# Patient Record
Sex: Male | Born: 1975 | Race: White | Hispanic: No | Marital: Single | Smoking: Never smoker
Health system: Southern US, Community
[De-identification: ages and names within clinical notes are randomized; demographics above are authoritative.]

## PROBLEM LIST (undated history)

## (undated) DIAGNOSIS — N200 Calculus of kidney: Secondary | ICD-10-CM

## (undated) DIAGNOSIS — R319 Hematuria, unspecified: Secondary | ICD-10-CM

## (undated) DIAGNOSIS — N201 Calculus of ureter: Secondary | ICD-10-CM

## (undated) DIAGNOSIS — R350 Frequency of micturition: Secondary | ICD-10-CM

## (undated) DIAGNOSIS — K5900 Constipation, unspecified: Secondary | ICD-10-CM

## (undated) DIAGNOSIS — I10 Essential (primary) hypertension: Secondary | ICD-10-CM

## (undated) DIAGNOSIS — Z973 Presence of spectacles and contact lenses: Secondary | ICD-10-CM

## (undated) DIAGNOSIS — Z87442 Personal history of urinary calculi: Secondary | ICD-10-CM

## (undated) DIAGNOSIS — R300 Dysuria: Secondary | ICD-10-CM

## (undated) DIAGNOSIS — S83519A Sprain of anterior cruciate ligament of unspecified knee, initial encounter: Secondary | ICD-10-CM

## (undated) DIAGNOSIS — M1990 Unspecified osteoarthritis, unspecified site: Secondary | ICD-10-CM

## (undated) DIAGNOSIS — R109 Unspecified abdominal pain: Secondary | ICD-10-CM

## (undated) DIAGNOSIS — F419 Anxiety disorder, unspecified: Secondary | ICD-10-CM

## (undated) HISTORY — DX: Anxiety disorder, unspecified: F41.9

## (undated) HISTORY — PX: LAPAROSCOPIC APPENDECTOMY: SUR753

---

## 1998-04-07 ENCOUNTER — Emergency Department (HOSPITAL_COMMUNITY): Admission: EM | Admit: 1998-04-07 | Discharge: 1998-04-07 | Payer: Self-pay | Admitting: Emergency Medicine

## 1998-05-29 ENCOUNTER — Ambulatory Visit (HOSPITAL_COMMUNITY): Admission: RE | Admit: 1998-05-29 | Discharge: 1998-05-29 | Payer: Self-pay | Admitting: Urology

## 1998-09-28 ENCOUNTER — Emergency Department (HOSPITAL_COMMUNITY): Admission: EM | Admit: 1998-09-28 | Discharge: 1998-09-28 | Payer: Self-pay | Admitting: Emergency Medicine

## 1999-03-22 ENCOUNTER — Encounter: Payer: Self-pay | Admitting: Emergency Medicine

## 1999-03-22 ENCOUNTER — Emergency Department (HOSPITAL_COMMUNITY): Admission: EM | Admit: 1999-03-22 | Discharge: 1999-03-22 | Payer: Self-pay | Admitting: Emergency Medicine

## 2001-06-02 ENCOUNTER — Encounter: Payer: Self-pay | Admitting: Emergency Medicine

## 2001-06-02 ENCOUNTER — Emergency Department (HOSPITAL_COMMUNITY): Admission: EM | Admit: 2001-06-02 | Discharge: 2001-06-02 | Payer: Self-pay | Admitting: Emergency Medicine

## 2001-10-26 ENCOUNTER — Emergency Department (HOSPITAL_COMMUNITY): Admission: EM | Admit: 2001-10-26 | Discharge: 2001-10-26 | Payer: Self-pay | Admitting: Emergency Medicine

## 2001-10-26 ENCOUNTER — Encounter: Payer: Self-pay | Admitting: Emergency Medicine

## 2003-05-09 ENCOUNTER — Encounter: Payer: Self-pay | Admitting: Emergency Medicine

## 2003-05-09 ENCOUNTER — Emergency Department (HOSPITAL_COMMUNITY): Admission: EM | Admit: 2003-05-09 | Discharge: 2003-05-09 | Payer: Self-pay | Admitting: Emergency Medicine

## 2004-05-11 ENCOUNTER — Emergency Department (HOSPITAL_COMMUNITY): Admission: EM | Admit: 2004-05-11 | Discharge: 2004-05-11 | Payer: Self-pay | Admitting: Emergency Medicine

## 2004-05-20 ENCOUNTER — Ambulatory Visit (HOSPITAL_COMMUNITY): Admission: RE | Admit: 2004-05-20 | Discharge: 2004-05-20 | Payer: Self-pay | Admitting: Orthopedic Surgery

## 2004-05-31 ENCOUNTER — Emergency Department (HOSPITAL_COMMUNITY): Admission: EM | Admit: 2004-05-31 | Discharge: 2004-06-01 | Payer: Self-pay

## 2005-03-27 ENCOUNTER — Emergency Department (HOSPITAL_COMMUNITY): Admission: EM | Admit: 2005-03-27 | Discharge: 2005-03-27 | Payer: Self-pay | Admitting: *Deleted

## 2005-04-07 ENCOUNTER — Emergency Department (HOSPITAL_COMMUNITY): Admission: EM | Admit: 2005-04-07 | Discharge: 2005-04-07 | Payer: Self-pay | Admitting: Emergency Medicine

## 2006-01-16 ENCOUNTER — Encounter (INDEPENDENT_AMBULATORY_CARE_PROVIDER_SITE_OTHER): Payer: Self-pay | Admitting: *Deleted

## 2006-01-16 ENCOUNTER — Ambulatory Visit (HOSPITAL_COMMUNITY): Admission: EM | Admit: 2006-01-16 | Discharge: 2006-01-16 | Payer: Self-pay | Admitting: Emergency Medicine

## 2006-05-28 IMAGING — CR DG KNEE COMPLETE 4+V*R*
4 series · 4 of 4 positions shown · non-contrast
Comparison: 05/11/04 and MRI from 05/20/04.

CLINICAL DATA: Knee injury with pain.  
 RIGHT KNEE (FOUR VIEWS) 05/31/04

[view not recorded (1 of 4)]
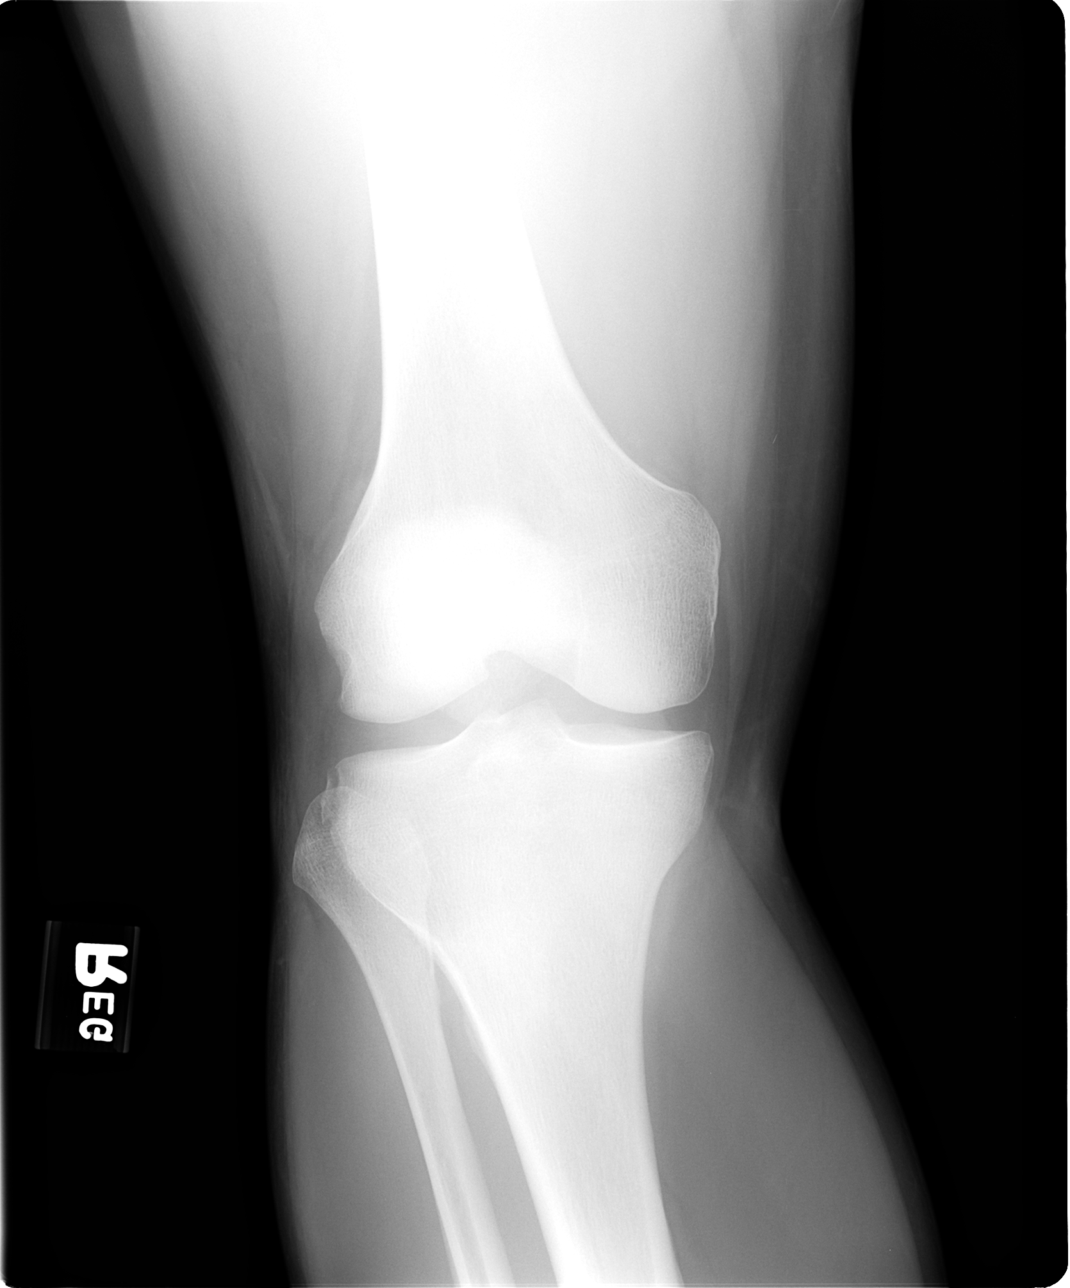

[view not recorded (2 of 4)]
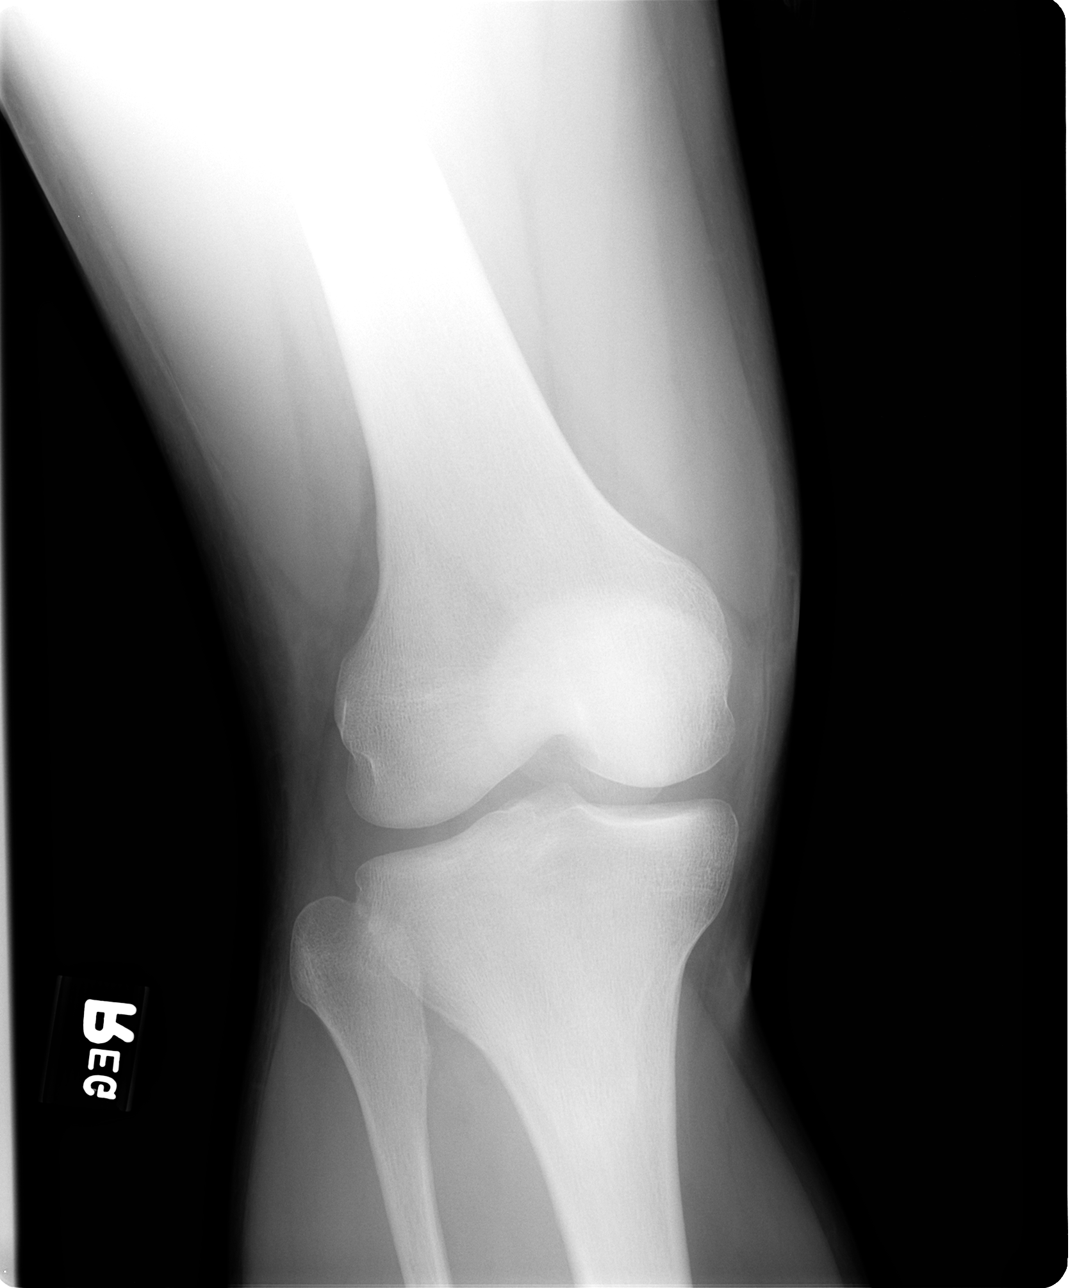

[view not recorded (3 of 4)]
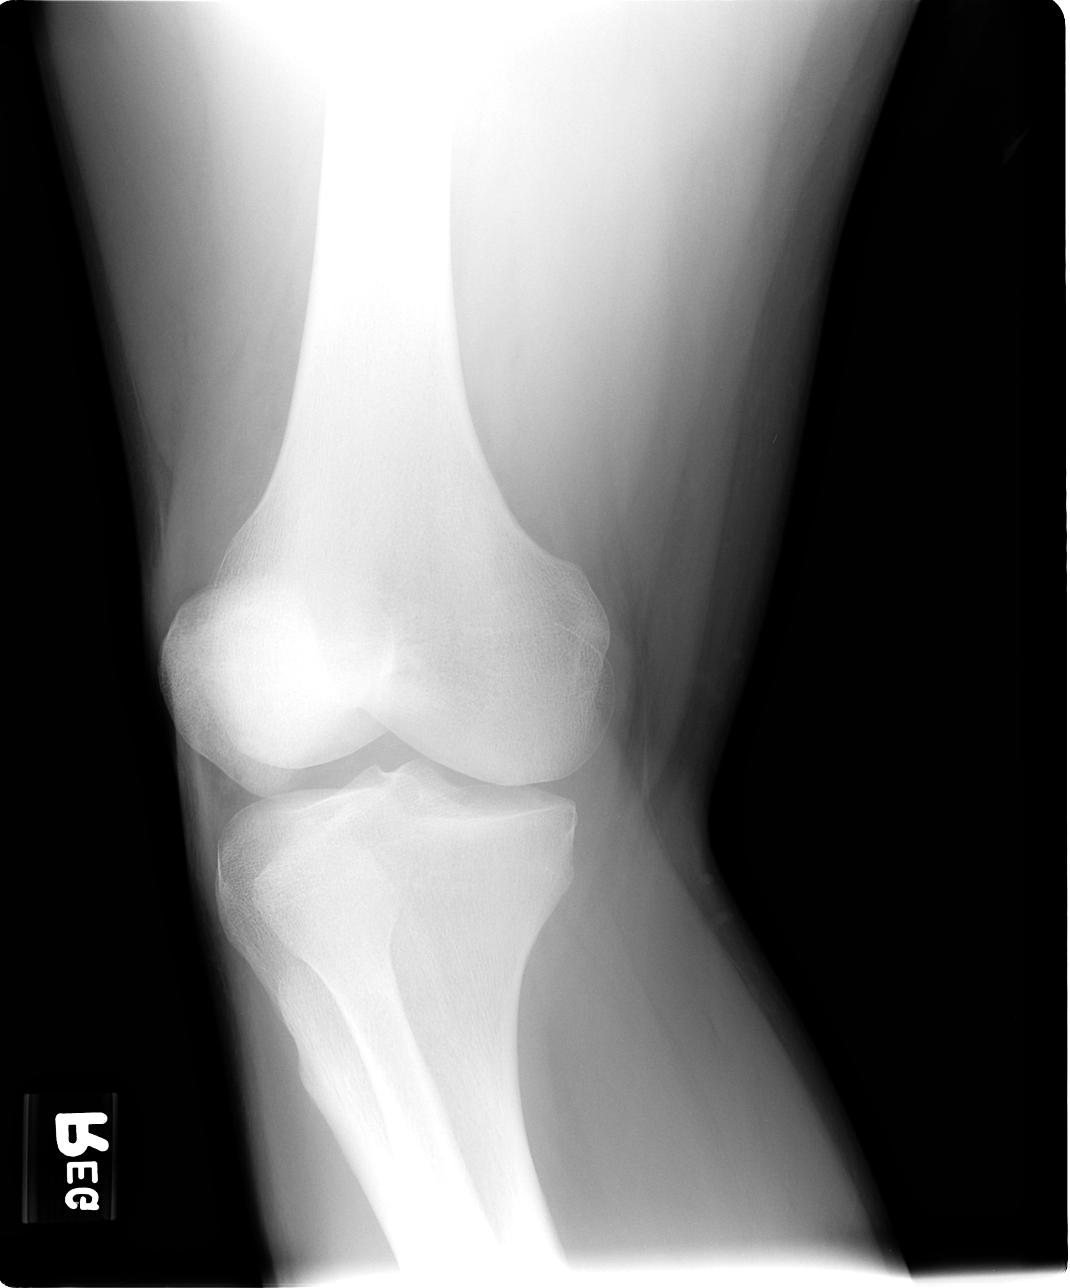

[view not recorded (4 of 4)]
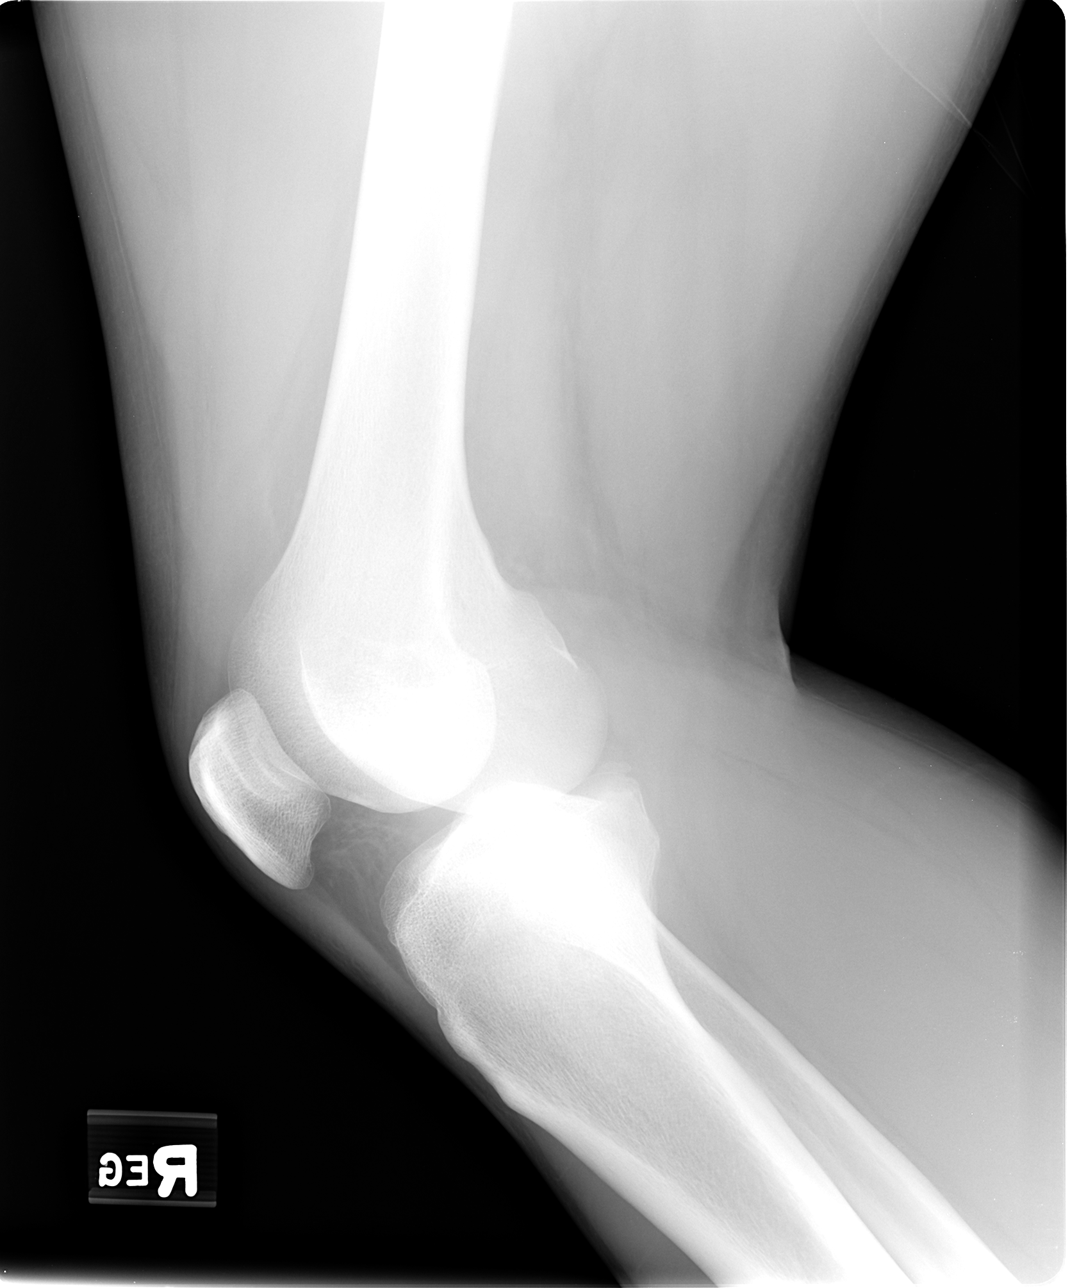

[4 of 4 positions shown; findings below may reference images not displayed]

FINDINGS: Four view exam of the right knee again shows the avulsion fragment adjacent to the lateral tibial plateau, stable.  No acute or new bony findings apparent.  Fluid in the suprapatellar bursa is again noted. 
 IMPRESSION
 Stable exam without acute bony abnormality.

## 2007-05-26 ENCOUNTER — Emergency Department (HOSPITAL_COMMUNITY): Admission: EM | Admit: 2007-05-26 | Discharge: 2007-05-26 | Payer: Self-pay | Admitting: Emergency Medicine

## 2010-10-08 ENCOUNTER — Emergency Department (HOSPITAL_COMMUNITY)
Admission: EM | Admit: 2010-10-08 | Discharge: 2010-10-08 | Payer: Self-pay | Source: Home / Self Care | Admitting: Emergency Medicine

## 2011-01-07 LAB — URINALYSIS, ROUTINE W REFLEX MICROSCOPIC
Bilirubin Urine: NEGATIVE
Glucose, UA: NEGATIVE mg/dL
Hgb urine dipstick: NEGATIVE
pH: 7.5 (ref 5.0–8.0)

## 2011-03-15 NOTE — Op Note (Signed)
NAMEELVERT, CUMPTON NO.:  [id number]   MEDICAL RECORD NO.:  [id number]          PATIENT TYPE:  INP   LOCATION:  0098                         FACILITY:  [facility]   PHYSICIAN:  Sharlet Salina T. Hoxworth, M.D.DATE OF BIRTH:  [birth year]   DATE OF PROCEDURE:  01/16/2006  DATE OF DISCHARGE:                                 OPERATIVE REPORT   PRE-AND-POSTOPERATIVE DIAGNOSIS:  Acute appendicitis.   SURGICAL PROCEDURE:  Laparoscopic appendectomy.   SURGEON:  Lorne Skeens. Hoxworth, M.D.   ANESTHESIA:  General.   BRIEF HISTORY:  Arnez Stoneking is a 35 year old male who presents with 2 days  of persistent right lower quadrant abdominal pain, nausea, and vomiting. He  has had a CT scan showing evidence of appendicitis. Laparoscopic  appendectomy has been recommended and accepted. The nature of the procedure,  indications, risks of bleeding, infection, and possible need for open  procedure were discussed and understood. He is now brought to the operating  room for this procedure.   DESCRIPTION OF PROCEDURE:  The patient was brought to operating room and  placed in the supine position on the operating table and general  endotracheal anesthesia was induced. He received broad spectrum preoperative  antibiotics. The abdomen was widely sterilely prepped and draped and a Foley  catheter placed. Local anesthesia was used to infiltrate the trocar sites.  An open Roseanne Reno technique was used at the umbilicus through a mattress suture  of #0 Vicryl and a 5-mm trocar placed in the right upper quadrant and an 11-  mm trocar in the left lower quadrant. The appendix was exposed and was  acutely inflamed without evidence of perforation or gangrene.   The appendix was mobilized dividing lateral peritoneal attachments with the  harmonic scalpel. The mesoappendix was then sequentially divided with the  harmonic scalpel completely freeing the appendix at its base which was  relatively not inflamed. The  appendix was then divided at its base with a  firing of the Endo GIA 45-mm blue load stapler. The appendix was placed in  an EndoCatch bag, and brought out through the umbilicus. The operative site  was inspect for hemostasis and was irrigated and hemostasis was complete.  Trocars were then removed and the mattress sutures secured at the umbilicus;  and all CO2 evacuated. Skin was closed with interrupted subcuticular 4-0  Monocryl and Steri-Strips.  Sponge, needle, and instrument counts were  correct. Dry dressings were applied; and the patient taken to the recovery  room in good condition.      Lorne Skeens. Hoxworth, M.D.  Electronically Signed     BTH/MEDQ  D:  01/16/2006  T:  01/17/2006  Job:  [id number]

## 2011-03-15 NOTE — H&P (Signed)
NAMEMARKEL, KURTENBACH                 ACCOUNT NO.:  [id number]   MEDICAL RECORD NO.:  [id number]          PATIENT TYPE:  INP   LOCATION:  1008                         FACILITY:  [facility]   PHYSICIAN:  Sharlet Salina T. Hoxworth, M.D.DATE OF BIRTH:  18-Feb-1976   DATE OF ADMISSION:  01/15/2006  DATE OF DISCHARGE:  01/16/2006                                HISTORY & PHYSICAL   CHIEF COMPLAINT:  Right lower quadrant abdominal pain.   HISTORY OF PRESENT ILLNESS:  Erik Dunlap is a generally healthy 35 year old  male who presents with a 2- to 3-day history of gradually worsening  abdominal pain localized in the right lower quadrant. This is constant,  aching and sharp pain. Worse with movement. He has had nausea and vomited  yesterday. He has a history of kidney stones and initially thought this was  related to that, but the pain has been more constant and severe. Denies  fever or chills. No urinary symptoms.   PAST MEDICAL HISTORY:  Has history of kidney stones. No previous surgery,  serious medical illness or hospitalizations.   MEDICATIONS:  None.   ALLERGIES:  None.   SOCIAL HISTORY:  Works doing heavy loading work. Does not smoke cigarettes.  He used to drink somewhat heavily but none recently.   FAMILY HISTORY:  Noncontributory.   REVIEW OF SYSTEMS:  GENERAL:  No fever or chills. RESPIRATORY:  Denies  shortness of breath, cough, wheezing. CARDIAC:  Denies chest pain, history  of cardiac disease. ABDOMEN:  GI as above. GU:  No urinary burning or  frequency.   PHYSICAL EXAMINATION:  VITAL SIGNS:  Temperature 97.4, pulse 64, respiration  20, blood pressure 137/63.  GENERAL:  Muscular white male in no acute distress.  SKIN:  Warm and dry.  HEENT:  Sclerae nonicteric. No masses.  LUNGS:  Clear without wheezing or increased work of breathing.  CARDIAC:  Regular rate and rhythm. No murmurs. No edema.  ABDOMEN:  Hypoactive bowel sounds. Nondistended. There is well localized  right lower  quadrant tenderness with guarding. No palpable mass or  hepatosplenomegaly.  EXTREMITIES:  No joint swelling or deformity.  NEUROLOGIC:  Alert, oriented.   LABORATORY:  White count elevated at 13,800; hemoglobin 15. Urinalysis  negative. Electrolytes within normal limits.   CT scan of the abdomen and pelvis was obtained emergency room which reveals  evidence of early appendicitis without perforation or abscess.   ASSESSMENT AND PLAN:  Acute appendicitis. The patient will be treated with  IV fluids, broad-spectrum antibiotics, and will be taken to the operating  room for laparoscopic appendectomy.      Lorne Skeens. Hoxworth, M.D.  Electronically Signed     BTH/MEDQ  D:  01/16/2006  T:  01/16/2006  Job:  [id number]

## 2011-03-26 ENCOUNTER — Emergency Department (HOSPITAL_COMMUNITY): Payer: Self-pay

## 2011-03-26 ENCOUNTER — Emergency Department (HOSPITAL_COMMUNITY)
Admission: EM | Admit: 2011-03-26 | Discharge: 2011-03-26 | Disposition: A | Discharge status: Home / Self Care | Payer: Self-pay | Attending: Emergency Medicine | Admitting: Emergency Medicine

## 2011-03-26 DIAGNOSIS — R110 Nausea: Secondary | ICD-10-CM | POA: Insufficient documentation

## 2011-03-26 DIAGNOSIS — H53149 Visual discomfort, unspecified: Secondary | ICD-10-CM | POA: Insufficient documentation

## 2011-03-26 DIAGNOSIS — R51 Headache: Secondary | ICD-10-CM | POA: Insufficient documentation

## 2011-03-26 DIAGNOSIS — M542 Cervicalgia: Secondary | ICD-10-CM | POA: Insufficient documentation

## 2011-08-12 LAB — DIFFERENTIAL
Lymphocytes Relative: 34
Lymphs Abs: 2.2
Monocytes Relative: 8
Neutro Abs: 3.5
Neutrophils Relative %: 56

## 2011-08-12 LAB — RAPID URINE DRUG SCREEN, HOSP PERFORMED
Amphetamines: NOT DETECTED
Benzodiazepines: NOT DETECTED
Cocaine: NOT DETECTED
Opiates: NOT DETECTED

## 2011-08-12 LAB — BASIC METABOLIC PANEL
Calcium: 9.2
GFR calc Af Amer: >60
Glucose, Bld: 82
Potassium: 4
Sodium: 138

## 2011-08-12 LAB — CBC
HCT: 40.1
MCHC: 35.3
RBC: 4.68
WBC: 6.4

## 2011-08-12 LAB — ETHANOL: Alcohol, Ethyl (B): <5

## 2011-09-10 ENCOUNTER — Emergency Department (HOSPITAL_COMMUNITY)
Admission: EM | Admit: 2011-09-10 | Discharge: 2011-09-11 | Disposition: A | Discharge status: Home / Self Care | Payer: Self-pay | Attending: Emergency Medicine | Admitting: Emergency Medicine

## 2011-09-10 ENCOUNTER — Encounter: Payer: Self-pay | Admitting: Emergency Medicine

## 2011-09-10 DIAGNOSIS — R110 Nausea: Secondary | ICD-10-CM | POA: Insufficient documentation

## 2011-09-10 DIAGNOSIS — R197 Diarrhea, unspecified: Secondary | ICD-10-CM | POA: Insufficient documentation

## 2011-09-10 DIAGNOSIS — R109 Unspecified abdominal pain: Secondary | ICD-10-CM | POA: Insufficient documentation

## 2011-09-10 DIAGNOSIS — R10A2 Flank pain, left side: Secondary | ICD-10-CM

## 2011-09-10 DIAGNOSIS — R300 Dysuria: Secondary | ICD-10-CM

## 2011-09-10 DIAGNOSIS — R350 Frequency of micturition: Secondary | ICD-10-CM | POA: Insufficient documentation

## 2011-09-10 MED ORDER — ONDANSETRON HCL 4 MG/2ML IJ SOLN: 4.0000 mg | Freq: Once | INTRAMUSCULAR | Status: DC

## 2011-09-10 MED ORDER — HYDROMORPHONE HCL PF 1 MG/ML IJ SOLN
1.0000 mg | Freq: Once | INTRAMUSCULAR | Status: AC
  Administered 2011-09-11: 1 mg via INTRAVENOUS
  Filled 2011-09-10: qty 1

## 2011-09-10 NOTE — ED Notes (Signed)
Patient with left side pain for one week.  Patient having nausea and diarrhea.

## 2011-09-10 NOTE — ED Notes (Signed)
Pt in restroom trying to provide urine sample.

## 2011-09-11 ENCOUNTER — Emergency Department (HOSPITAL_COMMUNITY): Payer: Self-pay

## 2011-09-11 MED ORDER — HYDROCODONE-ACETAMINOPHEN 5-325 MG PO TABS: 1.0000 | ORAL_TABLET | Freq: Four times a day (QID) | ORAL | Status: AC | PRN

## 2011-09-11 MED ORDER — ONDANSETRON HCL 4 MG/2ML IJ SOLN
INTRAMUSCULAR | Status: AC
  Filled 2011-09-11: qty 2

## 2011-09-11 NOTE — ED Provider Notes (Signed)
Medical screening examination/treatment/procedure(s) were conducted as a shared visit with non-physician practitioner(s) and myself.  I personally evaluated the patient during the encounter.  Pt with normal urine, to follow up with urology for ongoing flank pain and dysuria.  Olivia Mackie, MD 09/11/11 [phone]

## 2011-09-11 NOTE — ED Notes (Signed)
Pt to CT scan.

## 2011-09-11 NOTE — ED Provider Notes (Signed)
History     CSN: [id number] Arrival date & time: 09/10/2011  7:44 PM   First MD Initiated Contact with Patient 09/10/11 2340      Chief Complaint  Patient presents with  . Flank Pain    (Consider location/radiation/quality/duration/timing/severity/associated sxs/prior treatment) HPI Comments: Patient reports left flank pain with associated nausea x 2 weeks.  Today the pain became much more severe and began radiating into his testicles, he developed pain with urination and urinary frequency.  This morning he also had diarrhea.  Denies fever, vomiting, penile discharge, testicular swelling.    Patient is a 35 y.o. male presenting with flank pain.  Flank Pain    Past Medical History  Diagnosis Date  . Kidney calculus   . Kidney calculi     Past Surgical History  Procedure Date  . Appendectomy     History reviewed. No pertinent family history.  History  Substance Use Topics  . Smoking status: Never Smoker   . Smokeless tobacco: Not on file  . Alcohol Use: No      Review of Systems  Genitourinary: Positive for flank pain.  All other systems reviewed and are negative.    Allergies  Review of patient's allergies indicates no known allergies.  Home Medications   Current Outpatient Rx  Name Route Sig Dispense Refill  . ALPRAZOLAM 0.25 MG PO TABS Oral Take 0.25 mg by mouth at bedtime as needed. For anxiety/sleep.      BP 121/70  Pulse 96  Temp(Src) 97.7 F (36.5 C) (Oral)  Resp 17  SpO2 100%  Physical Exam  Constitutional: He is oriented to person, place, and time. He appears well-developed and well-nourished.  HENT:  Head: Normocephalic and atraumatic.  Neck: Neck supple.  Cardiovascular: Normal rate, regular rhythm and normal heart sounds.   Pulmonary/Chest: Breath sounds normal. No respiratory distress. He has no wheezes. He has no rales. He exhibits no tenderness.  Abdominal: Soft. Bowel sounds are normal. There is tenderness in the left upper  quadrant and left lower quadrant. There is CVA tenderness. No hernia. Hernia confirmed negative in the right inguinal area and confirmed negative in the left inguinal area.  Genitourinary: Testes normal and penis normal. Right testis shows no mass, no swelling and no tenderness. Left testis shows no mass, no swelling and no tenderness. Circumcised. No discharge found.  Lymphadenopathy:       Right: No inguinal adenopathy present.       Left: No inguinal adenopathy present.  Neurological: He is alert and oriented to person, place, and time.    ED Course  Procedures (including critical care time)   Labs Reviewed  URINALYSIS, ROUTINE W REFLEX MICROSCOPIC   Ct Abdomen Pelvis Wo Contrast  09/11/2011  *RADIOLOGY REPORT*  Clinical Data: 35 year old male with left flank pain, dysuria, frequency.  CT ABDOMEN AND PELVIS WITHOUT CONTRAST  Technique:  Multidetector CT imaging of the abdomen and pelvis was performed following the standard protocol without intravenous contrast.  Comparison: 01/16/2006.  Findings: Minor atelectasis at the lung bases. No acute osseous abnormality identified.  No pelvic free fluid.  Decompressed distal colon.  Sequelae of appendectomy at the cecum.  No dilated small bowel loops.  Negative noncontrast stomach, duodenum, liver, gallbladder (phrygian cap), spleen, pancreas, and adrenal glands.  Mid pole left nephrolithiasis measures up to 4 mm.  No left hydronephrosis, hydroureter, or perinephric stranding.  No calculus is identified along the course of the left ureter or within the bladder. Punctate right upper and  lower pole nephrolithiasis.  No right hydronephrosis, hydroureter or perinephric stranding.  No abdominal free fluid.  IMPRESSION: Bilateral nephrolithiasis without obstructive uropathy or acute inflammatory process in the abdomen or pelvis.  Original Report Authenticated By: Ulla Potash III, M.D.     1. Left flank pain   2. Dysuria       MDM  Patient signed out  to Dr Norlene Campbell at change of shift pending UA results.  Patient to follow up with urology regardless of outcome.          Dillard Cannon [place], [place] 09/11/11 [phone]

## 2013-03-25 ENCOUNTER — Ambulatory Visit: Payer: Self-pay

## 2013-03-25 ENCOUNTER — Ambulatory Visit: Payer: Self-pay | Admitting: Family Medicine

## 2013-03-25 ENCOUNTER — Emergency Department (HOSPITAL_COMMUNITY): Payer: Self-pay

## 2013-03-25 ENCOUNTER — Emergency Department (HOSPITAL_COMMUNITY)
Admission: EM | Admit: 2013-03-25 | Discharge: 2013-03-25 | Disposition: A | Discharge status: Home / Self Care | Payer: Self-pay | Attending: Emergency Medicine | Admitting: Emergency Medicine

## 2013-03-25 ENCOUNTER — Encounter (HOSPITAL_COMMUNITY): Payer: Self-pay

## 2013-03-25 VITALS — BP 124/76 | HR 72 | Temp 98.0°F | Resp 17 | Ht 68.5 in | Wt 206.0 lb

## 2013-03-25 DIAGNOSIS — M1990 Unspecified osteoarthritis, unspecified site: Secondary | ICD-10-CM | POA: Insufficient documentation

## 2013-03-25 DIAGNOSIS — M545 Low back pain, unspecified: Secondary | ICD-10-CM

## 2013-03-25 DIAGNOSIS — Z87442 Personal history of urinary calculi: Secondary | ICD-10-CM | POA: Insufficient documentation

## 2013-03-25 DIAGNOSIS — R110 Nausea: Secondary | ICD-10-CM | POA: Insufficient documentation

## 2013-03-25 DIAGNOSIS — Z8739 Personal history of other diseases of the musculoskeletal system and connective tissue: Secondary | ICD-10-CM | POA: Insufficient documentation

## 2013-03-25 DIAGNOSIS — Z79899 Other long term (current) drug therapy: Secondary | ICD-10-CM | POA: Insufficient documentation

## 2013-03-25 DIAGNOSIS — M47816 Spondylosis without myelopathy or radiculopathy, lumbar region: Secondary | ICD-10-CM

## 2013-03-25 DIAGNOSIS — F411 Generalized anxiety disorder: Secondary | ICD-10-CM | POA: Insufficient documentation

## 2013-03-25 DIAGNOSIS — M5136 Other intervertebral disc degeneration, lumbar region: Secondary | ICD-10-CM

## 2013-03-25 DIAGNOSIS — R3915 Urgency of urination: Secondary | ICD-10-CM

## 2013-03-25 DIAGNOSIS — M542 Cervicalgia: Secondary | ICD-10-CM | POA: Insufficient documentation

## 2013-03-25 DIAGNOSIS — R209 Unspecified disturbances of skin sensation: Secondary | ICD-10-CM | POA: Insufficient documentation

## 2013-03-25 DIAGNOSIS — R279 Unspecified lack of coordination: Secondary | ICD-10-CM | POA: Insufficient documentation

## 2013-03-25 DIAGNOSIS — R200 Anesthesia of skin: Secondary | ICD-10-CM

## 2013-03-25 DIAGNOSIS — M51369 Other intervertebral disc degeneration, lumbar region without mention of lumbar back pain or lower extremity pain: Secondary | ICD-10-CM

## 2013-03-25 DIAGNOSIS — M5126 Other intervertebral disc displacement, lumbar region: Secondary | ICD-10-CM

## 2013-03-25 DIAGNOSIS — M47817 Spondylosis without myelopathy or radiculopathy, lumbosacral region: Secondary | ICD-10-CM | POA: Insufficient documentation

## 2013-03-25 DIAGNOSIS — R29898 Other symptoms and signs involving the musculoskeletal system: Secondary | ICD-10-CM

## 2013-03-25 HISTORY — DX: Sprain of anterior cruciate ligament of unspecified knee, initial encounter: S83.519A

## 2013-03-25 LAB — POCT URINALYSIS DIPSTICK
Glucose, UA: NEGATIVE
Ketones, UA: NEGATIVE
Leukocytes, UA: NEGATIVE
Protein, UA: NEGATIVE
Urobilinogen, UA: 0.2
pH, UA: 7

## 2013-03-25 LAB — POCT UA - MICROSCOPIC ONLY
Crystals, Ur, HPF, POC: NEGATIVE
RBC, urine, microscopic: NEGATIVE
Yeast, UA: NEGATIVE

## 2013-03-25 MED ORDER — IBUPROFEN 800 MG PO TABS: 800.0000 mg | ORAL_TABLET | Freq: Three times a day (TID) | ORAL | Status: DC

## 2013-03-25 MED ORDER — OXYCODONE-ACETAMINOPHEN 5-325 MG PO TABS
2.0000 | ORAL_TABLET | Freq: Once | ORAL | Status: AC
  Administered 2013-03-25: 2 via ORAL
  Filled 2013-03-25: qty 2

## 2013-03-25 MED ORDER — DIAZEPAM 5 MG PO TABS: 5.0000 mg | ORAL_TABLET | Freq: Two times a day (BID) | ORAL | Status: DC

## 2013-03-25 MED ORDER — HYDROCODONE-ACETAMINOPHEN 5-325 MG PO TABS: ORAL_TABLET | ORAL | Status: DC

## 2013-03-25 MED ORDER — KETOROLAC TROMETHAMINE 60 MG/2ML IM SOLN
60.0000 mg | Freq: Once | INTRAMUSCULAR | Status: AC
  Administered 2013-03-25: 60 mg via INTRAMUSCULAR

## 2013-03-25 NOTE — ED Provider Notes (Signed)
History  This chart was scribed for non-physician practitioner, Hector Shade, working with Dione Booze, MD by Ardeen Jourdain, ED Scribe. This patient was seen in room TR05C/TR05C and the patient's care was started at 1633.  CSN: [id number]  Arrival date & time 03/25/13  1521   First MD Initiated Contact with Patient 03/25/13 1633      Chief Complaint  Patient presents with  . Back Pain     The history is provided by the patient. No language interpreter was used.    HPI Comments: Erik Dunlap is a 37 y.o. male who presents to the Emergency Department complaining of gradual onset, gradually worsening, constant lower back pain with associated bilateral foot numbness, nausea and urgency. Pt states the pain began after helping a friend move 1 day ago. He states the pain began after bending down to put some boxes on the ground. He describes the pain as a "shooting sensation." Pt states the pain radiates down his left leg. Pt admits to minor numbness and tingling in the groin. Pt states he has neck pain when he moves his head up. Pt was sent to [facility] from [facility] for evaluation and possible MRI. Pt received 60 mg Toradol IM at Urgent Care. Pt has a h/o back injuries.    Past Medical History  Diagnosis Date  . Kidney calculus   . Kidney calculi   . Anxiety   . ACL (anterior cruciate ligament) tear     Past Surgical History  Procedure Laterality Date  . Appendectomy      No family history on file.  History  Substance Use Topics  . Smoking status: Never Smoker   . Smokeless tobacco: Not on file  . Alcohol Use: No      Review of Systems  Musculoskeletal: Positive for back pain.  Neurological: Positive for numbness.  All other systems reviewed and are negative.    Allergies  Review of patient's allergies indicates no known allergies.  Home Medications   Current Outpatient Rx  Name  Route  Sig  Dispense  Refill  . ALPRAZolam (XANAX) 0.25 MG tablet  Oral   Take 0.25 mg by mouth at bedtime as needed. For anxiety/sleep.         Marland Kitchen ibuprofen (ADVIL,MOTRIN) 200 MG tablet   Oral   Take 600 mg by mouth every 6 (six) hours as needed for pain.         . diazepam (VALIUM) 5 MG tablet   Oral   Take 1 tablet (5 mg total) by mouth 2 (two) times daily.   10 tablet   0   . HYDROcodone-acetaminophen (NORCO/VICODIN) 5-325 MG per tablet      Take 1-2 tabs every 6 hours as needed for pain.   6 tablet   0   . ibuprofen (ADVIL,MOTRIN) 800 MG tablet   Oral   Take 1 tablet (800 mg total) by mouth 3 (three) times daily.   21 tablet   0     Triage Vitals: BP 133/84  Pulse 72  Temp(Src) 97.9 F (36.6 C) (Oral)  Resp 16  SpO2 97%  Physical Exam  Nursing note and vitals reviewed. Constitutional: He is oriented to person, place, and time. He appears well-developed and well-nourished. No distress.  Pt sitting on edge of exam chair. Appears uncomfortable.   HENT:  Head: Normocephalic and atraumatic.  Eyes: EOM are normal. Pupils are equal, round, and reactive to light.  Neck: Normal range of motion.  Neck supple. No tracheal deviation present.  Cardiovascular: Normal rate and regular rhythm.  Exam reveals no gallop and no friction rub.   No murmur heard. Pulmonary/Chest: Effort normal and breath sounds normal. No respiratory distress. He has no wheezes. He has no rales. He exhibits no tenderness.  Abdominal: Soft. Bowel sounds are normal. He exhibits no distension. There is no tenderness.  Musculoskeletal: Normal range of motion. He exhibits tenderness ( Along lower lumbar spine and sacrum. Paraspinal muscles of lumbar spine). He exhibits no edema.  Pain with movement.  Neurological: He is alert and oriented to person, place, and time. He displays no atrophy and no tremor. He exhibits normal muscle tone. Gait ( antalgic) abnormal.  Skin: Skin is warm and dry. He is not diaphoretic.  Psychiatric: He has a normal mood and affect. His  behavior is normal.    ED Course  Procedures (including critical care time)  DIAGNOSTIC STUDIES: Oxygen Saturation is 97% on room air, normal by my interpretation.    COORDINATION OF CARE:  4:56 PM-Discussed treatment plan which includes MRI and pain medication with pt at bedside and pt agreed to plan.    Labs Reviewed - No data to display Dg Lumbar Spine Complete  03/25/2013   *RADIOLOGY REPORT*  Clinical Data: Back pain  LUMBAR SPINE - COMPLETE 4+ VIEW  Comparison: 09/11/2011  Findings: There is no vertebral compression deformity.  Stable moderate narrowing at L3-4, L4-5, and L5-S1.  No pars defect. Stable alignment with slight dextroscoliosis at L4-5.  No definite fracture.  Little if any facet arthropathy.  IMPRESSION: Stable chronic changes.  No acute bony pathology.  Clinically significant discrepancy from primary report, if provided: None   Original Report Authenticated By: Jolaine Click, M.D.   Mr Lumbar Spine Wo Contrast  03/25/2013   *RADIOLOGY REPORT*  Clinical Data: Low back injury after heavy lifting yesterday.  Pain radiates down both legs, left greater than right.  MRI LUMBAR SPINE WITHOUT CONTRAST  Technique:  Multiplanar and multiecho pulse sequences of the lumbar spine were obtained without intravenous contrast.  Comparison: 03/25/2013 and 09/11/2011  Findings: Despite efforts by the patient and technologist, motion artifact is present on some series of today's examination and could not be totally eliminated.  This reduces diagnostic sensitivity and specificity.  The lowest full intervertebral disk space is labeled L5-S1.  If procedural intervention is to be performed, careful correlation with this numbering strategy is recommended.  The conus medullaris appears unremarkable.  Conus level:  L1.  No significant vertebral subluxation.  Minimal degenerative endplate findings noted at L4-5 and L5-S1, with disc desiccation mild loss of disc height at L3-4, L4-5, and L5-S1.  Paraspinal  musculature unremarkable. Additional findings at individual levels are as follows:  L1-2:  Unremarkable.  L2-3:  Unremarkable.  L3-4:  Moderate central stenosis along with mild right and borderline left subarticular lateral recess stenosis due to a broad central disc protrusion.  L4-5:  Mild right and borderline left subarticular lateral recess stenosis along with borderline central stenosis due to away disc bulge and a small broad central disc protrusion which extends caudad.  L5-S1:  Borderline bilateral foraminal stenosis due to intervertebral spurring and disc bulge.  Central disc protrusion noted.  IMPRESSION:  1.  Lumbar degenerative disc disease and spondylosis, with moderate impingement at L3-4 and mild impingement at L4-5 as detailed above. The degenerative disc disease at these levels was present at least to some level on the prior CT scan from 09/11/2011.  Original Report Authenticated By: Gaylyn Rong, M.D.     1. LBP (low back pain)   2. Spondylosis of lumbar joint   3. Lumbar degenerative disc disease   4. Herniated lumbar disc without myelopathy       MDM  Pt presented from [facility] after experiencing severe LBP after lifting furniture earlier today.  Pt was advised he'd need MRI to see if he had a "buldging disc" and director to the ED.  Pt appeared uncomfortable in exam chair, TTP along lumbar spine and sacrum, along paraspinal muscles of lumbar spine.  Antalgic gait. Gave percocet prior to going to MRI.   MRI shows: lumbar degenerative disc disease and spondylosis, moderate impingement at L3-4 and mild impingement at L4-5.  Some degenerative disc disease was present in CT scan from 09/11/11.   Rx: valium, norco, ibuprofen. Provided pt education on LBP, herniated disc tx and sports rehab.  Advised pt to call for appointment with [facility] in 2-3 weeks after conservative treatment if pain is not improving, sooner if symptoms worsen.  Return to ED if unable to  void or lose control of bowel or bladder.  Pt verbalized understanding and agreement with treatment plan.   Discussed pt with Dr. Preston Fleeting who agreed with discharging pt home on conservative tx and pain control.  Return precautions given.   I personally performed the services described in this documentation, which was scribed in my presence. The recorded information has been reviewed and is accurate.     Junius Finner, PA-C 03/26/13 1439

## 2013-03-25 NOTE — ED Notes (Signed)
Pt presents to ED after being seen at Urgent care for back pain after helping a friend move yesterday. Urgent care doctor advised that he be seen and evaluated here to rule out a "buldging disc." Pt states that he has weakness in both legs and his feet feel numb. Pt was given Toradol 60mg  injection today at Urgent care.

## 2013-03-25 NOTE — ED Notes (Signed)
Sent from Dr. Chilton Si at [facility] for evaluation of lower back pain and weakness in legs.  MRI recommended from Dr Neva Seat.

## 2013-03-25 NOTE — Progress Notes (Addendum)
Subjective:    Patient ID: Erik Dunlap, male    DOB: [birth year], 37 y.o.   MRN: [id number]  HPI Erik Dunlap is a 37 y.o. male  Low back pain - happened yesterday helping a friend move. Noticed pain shoot down middle of spine to both legs/whole body when bending down over a box. Felt something pop in low back.  Occurred at 12:30pm yesterday. Feels numb into bottom of both feet and lower legs below knees - both sides. No bowel or bladder incontinence, but feels need to urinate frequently since this occurred. Slight feeling of pain into testicles, but not not numb.  Feel like he does have lower extremity weakness - requiring assistance to walk. Did drive self to office but required girlfriends assistance to get inside office.  Did have weakness in legs yesterday, numbness in undersurface of feet today.   Hx of lbp/strains with lifting weights in past, usually improves in a few weeks, but this feels different.   Tx: none today, ibuprofen - 600mg  x1 yesterday.   Past Medical History  Diagnosis Date  . Kidney calculus   . Kidney calculi   . Anxiety    Past Surgical History  Procedure Laterality Date  . Appendectomy     No Known Allergies Prior to Admission medications   Medication Sig Start Date End Date Taking? Authorizing Provider  ALPRAZolam (XANAX) 0.25 MG tablet Take 0.25 mg by mouth at bedtime as needed. For anxiety/sleep.   Yes Historical Provider, MD    History   Social History  . Marital Status: Single    Spouse Name: N/A    Number of Children: N/A  . Years of Education: N/A   Occupational History  . Not on file.   Social History Main Topics  . Smoking status: Never Smoker   . Smokeless tobacco: Not on file  . Alcohol Use: No  . Drug Use: Yes    Special: Marijuana  . Sexually Active: Yes    Birth Control/ Protection: None   Other Topics Concern  . Not on file   Social History Narrative  . No narrative on file     Review of Systems  Constitutional:  Negative for fever.  Genitourinary: Positive for urgency, frequency (small amounts frequently. ) and difficulty urinating. Negative for hematuria (hx of nephrolithiasis - last passed kidney stone 4-5 months ago. ).   As above with pain in lb, numbness lower legs to plantar feet. No incontinence, but urgency.      Objective:   Physical Exam  Constitutional: He is oriented to person, place, and time. He appears well-developed and well-nourished. He appears distressed (appears uncomfortable, but nontoxic.  ).  HENT:  Head: Normocephalic and atraumatic.  Neck: Normal range of motion.  Cardiovascular: Intact distal pulses.   Pulses:      Dorsalis pedis pulses are 2+ on the right side, and 2+ on the left side.  Pulmonary/Chest: Effort normal.  Abdominal: Soft. There is no tenderness.  Musculoskeletal: He exhibits tenderness.       Lumbar back: He exhibits decreased range of motion (guarded exam - resistant to any movement in back. ), tenderness and bony tenderness. He exhibits no swelling and no edema.       Back:  Neurological: He is alert and oriented to person, place, and time. No sensory deficit. He displays no Babinski's sign on the right side. He displays no Babinski's sign on the left side.  Reflex Scores:  Patellar reflexes are 2+ on the right side and 2+ on the left side.      Achilles reflexes are 2+ on the right side and 2+ on the left side. Resistent to any knee extension or flexion strength testing, wih minimal strength noted, and minimal movement/guarded with dorsi and plantar foot strength testing.    Skin: Skin is warm, dry and intact. No rash noted.     Psychiatric: He has a normal mood and affect. His behavior is normal.   toradol 60mg  IM given prior to xray - noted some improvement in symptoms - able to locate pain more now.   Results for orders placed in visit on 03/25/13  POCT UA - MICROSCOPIC ONLY      Result Value Range   WBC, Ur, HPF, POC 0-1     RBC,  urine, microscopic neg     Bacteria, U Microscopic neg     Mucus, UA neg     Epithelial cells, urine per micros neg     Crystals, Ur, HPF, POC neg     Casts, Ur, LPF, POC neg     Yeast, UA neg    POCT URINALYSIS DIPSTICK      Result Value Range   Color, UA yellow     Clarity, UA clear     Glucose, UA neg     Bilirubin, UA neg     Ketones, UA neg     Spec Grav, UA 1.015     Blood, UA neg     pH, UA 7.0     Protein, UA neg     Urobilinogen, UA 0.2     Nitrite, UA neg     Leukocytes, UA Negative      [facility] reading (PRIMARY) by  Dr. Neva Seat: LS spine: decreased lordosis, no acute fx identified.     Assessment & Plan:  Erik Dunlap is a 37 y.o. male Back pain, lumbosacral - Plan: ketorolac (TORADOL) injection 60 mg, DG Lumbar Spine Complete, POCT UA - Microscopic Only, POCT urinalysis dipstick  LBP- hx of strains in past by hx, but acute LBP after lifting yesterday afternoon, more severe than in past, but also associated with lower extremity weakness, and dysesthesia into base of feet.  No true incontinence, but urinary urgency and radiating pain into genital area/testicles. Will have evaluated in emergency room for probable LS spine MRI to ro HNP or cauda syndrome. Discussed with EDP.  Discussed diagnosis and concerns for this plan with pt - understanding expressed. Will have friend drive to emergency room.   Advised triage nurse at [facility] ER as well.     1835: Xray report reviewed:  LUMBAR SPINE - COMPLETE 4+ VIEW  Comparison: 09/11/2011  Findings: There is no vertebral compression deformity. Stable  moderate narrowing at L3-4, L4-5, and L5-S1. No pars defect.  Stable alignment with slight dextroscoliosis at L4-5. No definite  fracture. Little if any facet arthropathy.  IMPRESSION:  Stable chronic changes. No acute bony pathology.

## 2013-03-25 NOTE — Patient Instructions (Signed)
Go to [facility] emergency room after leaving our office - they are expecting you.

## 2013-03-27 NOTE — ED Provider Notes (Signed)
Medical screening examination/treatment/procedure(s) were performed by non-physician practitioner and as supervising physician I was immediately available for consultation/collaboration.  Thereasa Iannello, MD 03/27/13 0036 

## 2014-01-07 ENCOUNTER — Emergency Department (HOSPITAL_COMMUNITY)
Admission: EM | Admit: 2014-01-07 | Discharge: 2014-01-07 | Disposition: A | Discharge status: Home / Self Care | Payer: BC Managed Care – PPO | Attending: Emergency Medicine | Admitting: Emergency Medicine

## 2014-01-07 ENCOUNTER — Encounter (HOSPITAL_COMMUNITY): Payer: Self-pay | Admitting: Emergency Medicine

## 2014-01-07 DIAGNOSIS — Z87442 Personal history of urinary calculi: Secondary | ICD-10-CM | POA: Insufficient documentation

## 2014-01-07 DIAGNOSIS — Z87828 Personal history of other (healed) physical injury and trauma: Secondary | ICD-10-CM | POA: Insufficient documentation

## 2014-01-07 DIAGNOSIS — R6883 Chills (without fever): Secondary | ICD-10-CM | POA: Insufficient documentation

## 2014-01-07 DIAGNOSIS — R197 Diarrhea, unspecified: Secondary | ICD-10-CM | POA: Insufficient documentation

## 2014-01-07 DIAGNOSIS — F411 Generalized anxiety disorder: Secondary | ICD-10-CM | POA: Insufficient documentation

## 2014-01-07 DIAGNOSIS — R112 Nausea with vomiting, unspecified: Secondary | ICD-10-CM | POA: Insufficient documentation

## 2014-01-07 LAB — URINALYSIS, ROUTINE W REFLEX MICROSCOPIC
Bilirubin Urine: NEGATIVE
Glucose, UA: NEGATIVE mg/dL
HGB URINE DIPSTICK: NEGATIVE
Ketones, ur: NEGATIVE mg/dL
Leukocytes, UA: NEGATIVE
Nitrite: NEGATIVE
PROTEIN: NEGATIVE mg/dL
Specific Gravity, Urine: 1.01 (ref 1.005–1.030)
UROBILINOGEN UA: 0.2 mg/dL (ref 0.0–1.0)
pH: 8.5 — ABNORMAL HIGH (ref 5.0–8.0)

## 2014-01-07 LAB — CBC WITH DIFFERENTIAL/PLATELET
Basophils Absolute: 0 10*3/uL (ref 0.0–0.1)
Basophils Relative: 0 % (ref 0–1)
EOS PCT: 1 % (ref 0–5)
Eosinophils Absolute: 0.1 10*3/uL (ref 0.0–0.7)
HEMATOCRIT: 42.9 % (ref 39.0–52.0)
Hemoglobin: 15.7 g/dL (ref 13.0–17.0)
LYMPHS ABS: 1.2 10*3/uL (ref 0.7–4.0)
LYMPHS PCT: 11 % — AB (ref 12–46)
MCH: 30.9 pg (ref 26.0–34.0)
MCHC: 36.6 g/dL — ABNORMAL HIGH (ref 30.0–36.0)
MCV: 84.4 fL (ref 78.0–100.0)
MONO ABS: 0.6 10*3/uL (ref 0.1–1.0)
MONOS PCT: 5 % (ref 3–12)
Neutro Abs: 9.1 10*3/uL — ABNORMAL HIGH (ref 1.7–7.7)
Neutrophils Relative %: 83 % — ABNORMAL HIGH (ref 43–77)
Platelets: 291 10*3/uL (ref 150–400)
RBC: 5.08 MIL/uL (ref 4.22–5.81)
RDW: 12.3 % (ref 11.5–15.5)
WBC: 10.9 10*3/uL — AB (ref 4.0–10.5)

## 2014-01-07 LAB — COMPREHENSIVE METABOLIC PANEL
ALT: 83 U/L — AB (ref 0–53)
AST: 45 U/L — ABNORMAL HIGH (ref 0–37)
Albumin: 3.9 g/dL (ref 3.5–5.2)
Alkaline Phosphatase: 87 U/L (ref 39–117)
BUN: 13 mg/dL (ref 6–23)
CALCIUM: 10 mg/dL (ref 8.4–10.5)
CO2: 25 meq/L (ref 19–32)
CREATININE: 0.8 mg/dL (ref 0.50–1.35)
Chloride: 101 mEq/L (ref 96–112)
GLUCOSE: 111 mg/dL — AB (ref 70–99)
Potassium: 3.7 mEq/L (ref 3.7–5.3)
Sodium: 142 mEq/L (ref 137–147)
Total Bilirubin: 0.5 mg/dL (ref 0.3–1.2)
Total Protein: 7.9 g/dL (ref 6.0–8.3)

## 2014-01-07 LAB — LIPASE, BLOOD: Lipase: 33 U/L (ref 11–59)

## 2014-01-07 LAB — POC OCCULT BLOOD, ED: FECAL OCCULT BLD: NEGATIVE

## 2014-01-07 MED ORDER — SODIUM CHLORIDE 0.9 % IV BOLUS (SEPSIS)
1000.0000 mL | Freq: Once | INTRAVENOUS | Status: AC
  Administered 2014-01-07: 1000 mL via INTRAVENOUS

## 2014-01-07 MED ORDER — PROMETHAZINE HCL 25 MG PO TABS: 25.0000 mg | ORAL_TABLET | Freq: Four times a day (QID) | ORAL | Status: DC | PRN

## 2014-01-07 MED ORDER — DIPHENHYDRAMINE HCL 50 MG/ML IJ SOLN
25.0000 mg | Freq: Once | INTRAMUSCULAR | Status: AC
  Administered 2014-01-07: 25 mg via INTRAVENOUS
  Filled 2014-01-07: qty 1

## 2014-01-07 MED ORDER — ONDANSETRON HCL 4 MG/2ML IJ SOLN
4.0000 mg | Freq: Once | INTRAMUSCULAR | Status: AC
  Administered 2014-01-07: 4 mg via INTRAVENOUS
  Filled 2014-01-07: qty 2

## 2014-01-07 MED ORDER — LORAZEPAM 2 MG/ML IJ SOLN
1.0000 mg | Freq: Once | INTRAMUSCULAR | Status: AC
  Administered 2014-01-07: 2 mg via INTRAVENOUS
  Filled 2014-01-07: qty 1

## 2014-01-07 MED ORDER — METOCLOPRAMIDE HCL 5 MG/ML IJ SOLN
10.0000 mg | Freq: Once | INTRAMUSCULAR | Status: AC
  Administered 2014-01-07: 10 mg via INTRAVENOUS
  Filled 2014-01-07: qty 2

## 2014-01-07 NOTE — Discharge Instructions (Signed)
Viral Gastroenteritis Viral gastroenteritis is also known as stomach flu. This condition affects the stomach and intestinal tract. It can cause sudden diarrhea and vomiting. The illness typically lasts 3 to 8 days. Most people develop an immune response that eventually gets rid of the virus. While this natural response develops, the virus can make you quite ill. CAUSES  Many different viruses can cause gastroenteritis, such as rotavirus or noroviruses. You can catch one of these viruses by consuming contaminated food or water. You may also catch a virus by sharing utensils or other personal items with an infected person or by touching a contaminated surface. SYMPTOMS  The most common symptoms are diarrhea and vomiting. These problems can cause a severe loss of body fluids (dehydration) and a body salt (electrolyte) imbalance. Other symptoms may include:  Fever.  Headache.  Fatigue.  Abdominal pain. DIAGNOSIS  Your caregiver can usually diagnose viral gastroenteritis based on your symptoms and a physical exam. A stool sample may also be taken to test for the presence of viruses or other infections. TREATMENT  This illness typically goes away on its own. Treatments are aimed at rehydration. The most serious cases of viral gastroenteritis involve vomiting so severely that you are not able to keep fluids down. In these cases, fluids must be given through an intravenous line (IV). HOME CARE INSTRUCTIONS   Drink enough fluids to keep your urine clear or pale yellow. Drink small amounts of fluids frequently and increase the amounts as tolerated.  Ask your caregiver for specific rehydration instructions.  Avoid:  Foods high in sugar.  Alcohol.  Carbonated drinks.  Tobacco.  Juice.  Caffeine drinks.  Extremely hot or cold fluids.  Fatty, greasy foods.  Too much intake of anything at one time.  Dairy products until 24 to 48 hours after diarrhea stops.  You may consume probiotics.  Probiotics are active cultures of beneficial bacteria. They may lessen the amount and number of diarrheal stools in adults. Probiotics can be found in yogurt with active cultures and in supplements.  Wash your hands well to avoid spreading the virus.  Only take over-the-counter or prescription medicines for pain, discomfort, or fever as directed by your caregiver. Do not give aspirin to children. Antidiarrheal medicines are not recommended.  Ask your caregiver if you should continue to take your regular prescribed and over-the-counter medicines.  Keep all follow-up appointments as directed by your caregiver. SEEK IMMEDIATE MEDICAL CARE IF:   You are unable to keep fluids down.  You do not urinate at least once every 6 to 8 hours.  You develop shortness of breath.  You notice blood in your stool or vomit. This may look like coffee grounds.  You have abdominal pain that increases or is concentrated in one small area (localized).  You have persistent vomiting or diarrhea.  You have a fever.  The patient is a child younger than 3 months, and he or she has a fever.  The patient is a child older than 3 months, and he or she has a fever and persistent symptoms.  The patient is a child older than 3 months, and he or she has a fever and symptoms suddenly get worse.  The patient is a baby, and he or she has no tears when crying. MAKE SURE YOU:   Understand these instructions.  Will watch your condition.  Will get help right away if you are not doing well or get worse. Document Released: 10/14/2005 Document Revised: 01/06/2012 Document Reviewed: 07/31/2011   [employer] Patient Information 2014 [employer].  

## 2014-01-07 NOTE — ED Notes (Addendum)
Per EMS- n/v since 0600 this morning. Had some loose dark, tarry stools this morning. With n/v pt became anxious. Given 4 mg zofran. BP 124/62, HR 75, 98% RA, CBG 126. Had left ACL replacement last week. Denies cp or SOB.

## 2014-01-07 NOTE — ED Notes (Signed)
Patient became anxious, diaphoretic and stated "I just don't feel right, something is wrong" post administration of Reglan. EDP notified and medications ordered.

## 2014-01-07 NOTE — ED Notes (Signed)
Pt talking on cellphone

## 2014-01-08 NOTE — ED Provider Notes (Signed)
CSN: [id number]     Arrival date & time 01/07/14  4098 History   First MD Initiated Contact with Patient 01/07/14 1048     Chief Complaint  Patient presents with  . Nausea  . Emesis     (Consider location/radiation/quality/duration/timing/severity/associated sxs/prior Treatment) Patient is a 38 y.o. male presenting with vomiting. The history is provided by the patient. No language interpreter was used.  Emesis Severity:  Moderate Duration:  3 hours Timing:  Constant Quality:  Stomach contents Progression:  Unchanged Chronicity:  New Recent urination:  Normal Relieved by:  Nothing Worsened by:  Liquids Ineffective treatments:  None tried Associated symptoms: chills and diarrhea   Associated symptoms: no abdominal pain, no cough, no fever and no headaches   Diarrhea:    Quality:  Watery   Severity:  Moderate   Duration:  3 hours   Timing:  Constant   Progression:  Unchanged Risk factors: suspect food intake   Risk factors: no alcohol use, no diabetes, not pregnant now, no prior abdominal surgery, no sick contacts and no travel to endemic areas     Past Medical History  Diagnosis Date  . Kidney calculus   . Kidney calculi   . Anxiety   . ACL (anterior cruciate ligament) tear    Past Surgical History  Procedure Laterality Date  . Appendectomy     No family history on file. History  Substance Use Topics  . Smoking status: Never Smoker   . Smokeless tobacco: Not on file  . Alcohol Use: Yes    Review of Systems  Constitutional: Positive for chills. Negative for fever, activity change, appetite change and fatigue.  HENT: Negative for congestion, facial swelling, rhinorrhea and trouble swallowing.   Eyes: Negative for photophobia and pain.  Respiratory: Negative for cough, chest tightness and shortness of breath.   Cardiovascular: Negative for chest pain and leg swelling.  Gastrointestinal: Positive for nausea, vomiting and diarrhea. Negative for abdominal pain and  constipation.  Endocrine: Negative for polydipsia and polyuria.  Genitourinary: Negative for dysuria, urgency, decreased urine volume and difficulty urinating.  Musculoskeletal: Negative for back pain and gait problem.  Skin: Negative for color change, rash and wound.  Allergic/Immunologic: Negative for immunocompromised state.  Neurological: Negative for dizziness, facial asymmetry, speech difficulty, weakness, numbness and headaches.  Psychiatric/Behavioral: Negative for confusion, decreased concentration and agitation.      Allergies  Review of patient's allergies indicates no known allergies.  Home Medications   Current Outpatient Rx  Name  Route  Sig  Dispense  Refill  . ALPRAZolam (XANAX) 0.25 MG tablet   Oral   Take 0.25 mg by mouth at bedtime as needed. For anxiety/sleep.         . methocarbamol (ROBAXIN) 500 MG tablet   Oral   Take 500 mg by mouth every 6 (six) hours as needed for muscle spasms.          . Multiple Vitamins-Minerals (MULTIVITAMIN WITH MINERALS) tablet   Oral   Take 1 tablet by mouth daily.         Marland Kitchen oxyCODONE-acetaminophen (PERCOCET) 10-325 MG per tablet   Oral   Take 1 tablet by mouth every 6 (six) hours as needed for pain.          . promethazine (PHENERGAN) 25 MG tablet   Oral   Take 25 mg by mouth every 6 (six) hours as needed for nausea or vomiting.          . promethazine (PHENERGAN)  25 MG tablet   Oral   Take 1 tablet (25 mg total) by mouth every 6 (six) hours as needed for nausea or vomiting.   30 tablet   0    BP 129/81  Pulse 86  Temp(Src) 98.2 F (36.8 C) (Oral)  Resp 14  SpO2 100% Physical Exam  Constitutional: He is oriented to person, place, and time. He appears well-developed and well-nourished. No distress.  HENT:  Head: Normocephalic and atraumatic.  Mouth/Throat: No oropharyngeal exudate.  Eyes: Pupils are equal, round, and reactive to light.  Neck: Normal range of motion. Neck supple.  Cardiovascular:  Normal rate, regular rhythm and normal heart sounds.  Exam reveals no gallop and no friction rub.   No murmur heard. Pulmonary/Chest: Effort normal and breath sounds normal. No respiratory distress. He has no wheezes. He has no rales.  Abdominal: Soft. Bowel sounds are normal. He exhibits no distension and no mass. There is no tenderness. There is no rebound and no guarding.  Musculoskeletal: Normal range of motion. He exhibits no edema and no tenderness.       Legs: Neurological: He is alert and oriented to person, place, and time.  Skin: Skin is warm and dry.  Psychiatric: He has a normal mood and affect.    ED Course  Procedures (including critical care time) Labs Review Labs Reviewed  CBC WITH DIFFERENTIAL - Abnormal; Notable for the following:    WBC 10.9 (*)    MCHC 36.6 (*)    Neutrophils Relative % 83 (*)    Neutro Abs 9.1 (*)    Lymphocytes Relative 11 (*)    All other components within normal limits  COMPREHENSIVE METABOLIC PANEL - Abnormal; Notable for the following:    Glucose, Bld 111 (*)    AST 45 (*)    ALT 83 (*)    All other components within normal limits  URINALYSIS, ROUTINE W REFLEX MICROSCOPIC - Abnormal; Notable for the following:    pH 8.5 (*)    All other components within normal limits  LIPASE, BLOOD  POC OCCULT BLOOD, ED   Imaging Review No results found.   EKG Interpretation None      MDM   Final diagnoses:  Nausea vomiting and diarrhea    Pt is a 38 y.o. male with Pmhx as above who presents with suddeon onset n/v, d/a early this morning. He had an ACL repair about 1 week ago. Denies recent abx, sick contacts, reports he ate some chicken that tasted strange last night. Emesis non-bloody, non-bilious.  D/a reported to be dark black.  No abdominal pain.  On PE, VSS, pt in NAD.  Cardiopulm & abdominal exam benign. Pt given IVF, zofran, followed by reglan after which he experience a dystonic reaction that was relieved by benadryl/ativan. Pt then  able to tolerate PO.  Suspect viral gastroenteritis vs food poisoning. I feel he is safe to continue supportive care at home w/ phenergan.         Shanna CiscoMegan E Gabriel Conry, MD 01/08/14 [phone]

## 2014-08-10 ENCOUNTER — Encounter (INDEPENDENT_AMBULATORY_CARE_PROVIDER_SITE_OTHER): Payer: BC Managed Care – PPO | Admitting: Ophthalmology

## 2014-08-10 DIAGNOSIS — H35713 Central serous chorioretinopathy, bilateral: Secondary | ICD-10-CM

## 2014-08-10 DIAGNOSIS — H43813 Vitreous degeneration, bilateral: Secondary | ICD-10-CM

## 2014-08-10 DIAGNOSIS — H3531 Nonexudative age-related macular degeneration: Secondary | ICD-10-CM

## 2014-10-28 HISTORY — PX: ANTERIOR CRUCIATE LIGAMENT REPAIR: SHX115

## 2016-03-15 ENCOUNTER — Emergency Department (HOSPITAL_COMMUNITY): Payer: Self-pay

## 2016-03-15 ENCOUNTER — Emergency Department (HOSPITAL_COMMUNITY)
Admission: EM | Admit: 2016-03-15 | Discharge: 2016-03-15 | Disposition: A | Discharge status: Home / Self Care | Payer: Self-pay | Attending: Emergency Medicine | Admitting: Emergency Medicine

## 2016-03-15 ENCOUNTER — Encounter (HOSPITAL_COMMUNITY): Payer: Self-pay | Admitting: Emergency Medicine

## 2016-03-15 DIAGNOSIS — M542 Cervicalgia: Secondary | ICD-10-CM

## 2016-03-15 DIAGNOSIS — R519 Headache, unspecified: Secondary | ICD-10-CM

## 2016-03-15 DIAGNOSIS — R51 Headache: Secondary | ICD-10-CM | POA: Insufficient documentation

## 2016-03-15 DIAGNOSIS — F419 Anxiety disorder, unspecified: Secondary | ICD-10-CM | POA: Insufficient documentation

## 2016-03-15 DIAGNOSIS — Z79899 Other long term (current) drug therapy: Secondary | ICD-10-CM | POA: Insufficient documentation

## 2016-03-15 MED ORDER — DIAZEPAM 5 MG/ML IJ SOLN
5.0000 mg | Freq: Once | INTRAMUSCULAR | Status: AC
  Administered 2016-03-15: 5 mg via INTRAVENOUS
  Filled 2016-03-15: qty 2

## 2016-03-15 MED ORDER — NAPROXEN 500 MG PO TABS: 500.0000 mg | ORAL_TABLET | Freq: Two times a day (BID) | ORAL | Status: DC

## 2016-03-15 MED ORDER — MORPHINE SULFATE (PF) 4 MG/ML IV SOLN
4.0000 mg | INTRAVENOUS | Status: DC | PRN
  Administered 2016-03-15: 4 mg via INTRAVENOUS
  Filled 2016-03-15: qty 1

## 2016-03-15 MED ORDER — METOCLOPRAMIDE HCL 5 MG/ML IJ SOLN
10.0000 mg | Freq: Once | INTRAMUSCULAR | Status: AC
  Administered 2016-03-15: 10 mg via INTRAVENOUS
  Filled 2016-03-15: qty 2

## 2016-03-15 MED ORDER — METHOCARBAMOL 500 MG PO TABS: 500.0000 mg | ORAL_TABLET | Freq: Three times a day (TID) | ORAL | Status: DC | PRN

## 2016-03-15 MED ORDER — DEXAMETHASONE SODIUM PHOSPHATE 10 MG/ML IJ SOLN
10.0000 mg | Freq: Once | INTRAMUSCULAR | Status: AC
  Administered 2016-03-15: 10 mg via INTRAVENOUS
  Filled 2016-03-15: qty 1

## 2016-03-15 NOTE — ED Notes (Signed)
Bed: WA01 Expected date:  Expected time: 12:35 PM Means of arrival:  Comments: EMS- migraine/neck pain

## 2016-03-15 NOTE — Discharge Instructions (Signed)
Your CT scan of your neck shows some mild degenerative changes/arthritis.  CT scans does not demonstrate herniated, or bulging disc disease well. The test of choice to look at Discs is an MRI.  Follow-up with primary care physician, or your neurosurgeon's office to discuss outpatient MRI. This would require having your multiple piercings removed.  If any point you feel you are becoming worse, please return to the emergency room immediately.  If you decide to undergo the MRI, you may also have your piercings removed, and return at any time. We can help you accomplished the MRI.  Concerning symptoms include:  Numbness, weakness or tingling in arms or legs.  Balance difficulty, any change in ability to control, or empty your bowel or bladder.

## 2016-03-15 NOTE — ED Notes (Signed)
Pt overhead telling MD that he has weakness in bilateral hands when trying to grip objects.  This was not reported to this Clinical research associatewriter.

## 2016-03-15 NOTE — ED Notes (Addendum)
Per EMS, Pt, from home, c/o intermittent migraine, nausea, neck pain, and anxiety x 1/.5 weeks.  Pain score 5/10.  Pt reports taking OTC pain medication w/ some relief.  Pt reports intermittent bilateral hand numbness/tingling w/ anxiety.  Denies current numbness and weakness.  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

## 2016-03-15 NOTE — ED Provider Notes (Signed)
CSN: [id number]     Arrival date & time 03/15/16  1240 History   First MD Initiated Contact with Patient 03/15/16 1249     Chief Complaint  Patient presents with  . Migraine  . Anxiety      HPI  Patient presents for evaluation of complaint of headache and upper neck pain.  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 Approximate 2 weeks ago on her performance he got pain in the back of his head the base of the skull and upper neck. Colic it "shot" from his upper neck to his head. Denies any Lhermitte's symptoms of shooting pains down his neck or into his arms. He states this resolved within a day.  Saturday, 6 days ago he was again performing. Not doing anything particularly physically active. He states he felt the same headache and upper neck pain. He denies any symptoms radiating down the spine or into his arms. Does not feel numbness weakness or tingling in his arms. Exception to this was today. He states that he was reading on the computer about strokes. He became anxious. He felt like he hyperventilated both of his hands became numb. This resolved.  He denies to me that he has weakness in his hands. Nursing note reviewed. The 1303 nurse's note stating "patient overheard telling M.D. that he has weakness in bilateral hands" is inaccurate. I reviewed this detail with the patient. He states that he felt tingling and that this has resolved. As I examine him as reported below patient reports normal strength to me.  Past Medical History  Diagnosis Date  . Kidney calculus   . Kidney calculi   . Anxiety   . ACL (anterior cruciate ligament) tear    Past Surgical History  Procedure Laterality Date  . Appendectomy     History reviewed. No pertinent family history. Social History  Substance Use Topics  . Smoking status: Never Smoker   . Smokeless tobacco: None  . Alcohol Use: Yes     Comment: socially    Review of Systems  Constitutional: Negative for  fever, chills, diaphoresis, appetite change and fatigue.  HENT: Negative for mouth sores, sore throat and trouble swallowing.   Eyes: Negative for visual disturbance.  Respiratory: Negative for cough, chest tightness, shortness of breath and wheezing.   Cardiovascular: Negative for chest pain.  Gastrointestinal: Negative for nausea, vomiting, abdominal pain, diarrhea and abdominal distention.  Endocrine: Negative for polydipsia, polyphagia and polyuria.  Genitourinary: Negative for dysuria, frequency and hematuria.  Musculoskeletal: Positive for neck pain. Negative for gait problem.  Skin: Negative for color change, pallor and rash.  Neurological: Positive for headaches. Negative for dizziness, syncope and light-headedness.  Hematological: Does not bruise/bleed easily.  Psychiatric/Behavioral: Negative for behavioral problems and confusion.      Allergies  Review of patient's allergies indicates no known allergies.  Home Medications   Prior to Admission medications   Medication Sig Start Date End Date Taking? Authorizing Provider  ALPRAZolam Prudy Feeler) 0.5 MG tablet Take 0.5 mg by mouth daily as needed for anxiety.   Yes Historical Provider, MD  Multiple Vitamins-Minerals (MULTIVITAMIN WITH MINERALS) tablet Take 1 tablet by mouth daily.   Yes Historical Provider, MD  methocarbamol (ROBAXIN) 500 MG tablet Take 1 tablet (500 mg total) by mouth 3 (three) times daily between meals as needed. 03/15/16   Rolland Porter, MD  naproxen (NAPROSYN) 500 MG tablet Take 1 tablet (500 mg total) by mouth  2 (two) times daily. 03/15/16   Rolland Porter, MD   BP 153/105 mmHg  Pulse 72  Temp(Src) 98.1 F (36.7 C) (Oral)  Resp 14  SpO2 98% Physical Exam  Constitutional: He is oriented to person, place, and time. He appears well-developed and well-nourished. No distress.  HENT:  Head: Normocephalic.  Eyes: Conjunctivae are normal. Pupils are equal, round, and reactive to light. No scleral icterus.  Neck: Normal  range of motion. Neck supple. No thyromegaly present.    Cardiovascular: Normal rate and regular rhythm.  Exam reveals no gallop and no friction rub.   No murmur heard. Pulmonary/Chest: Effort normal and breath sounds normal. No respiratory distress. He has no wheezes. He has no rales.  Abdominal: Soft. Bowel sounds are normal. He exhibits no distension. There is no tenderness. There is no rebound.  Musculoskeletal: Normal range of motion.  Neurological: He is alert and oriented to person, place, and time.  Normal symmetric Strength to shoulder shrug, triceps, biceps, grip,wrist flex/extend,and intrinsics  Norma lsymmetric sensation above and below clavicles, and to all distributions to UEs. Norma symmetric strength to flex/.extend hip and knees, dorsi/plantar flex ankles. Normal symmetric sensation to all distributions to LEs Patellar and achilles reflexes 1-2+. Downgoing Babinski   Skin: Skin is warm and dry. No rash noted.  Psychiatric: He has a normal mood and affect. His behavior is normal.    ED Course  Procedures (including critical care time) Labs Review Labs Reviewed - No data to display  Imaging Review Ct Head Wo Contrast  03/15/2016  CLINICAL DATA:  Migraine headache and neck pain for 2 weeks. EXAM: CT HEAD WITHOUT CONTRAST CT CERVICAL SPINE WITHOUT CONTRAST TECHNIQUE: Multidetector CT imaging of the head and cervical spine was performed following the standard protocol without intravenous contrast. Multiplanar CT image reconstructions of the cervical spine were also generated. COMPARISON:  CT scan of Mar 26, 2011. FINDINGS: CT HEAD FINDINGS Bony calvarium appears intact. No mass effect or midline shift is noted. Ventricular size is within normal limits. There is no evidence of mass lesion, hemorrhage or acute infarction. CT CERVICAL SPINE FINDINGS No fracture or spondylolisthesis is noted. Mild anterior osteophyte formation is noted at C5-6 and C6-7. Disc spaces and posterior  facet joints appear to be well maintained. Visualized lung apices are unremarkable. IMPRESSION: Normal head CT. Mild degenerative changes are noted in the cervical spine. No fracture or spondylolisthesis is noted. Electronically Signed   By: Lupita Raider, M.D.   On: 03/15/2016 14:00   Ct Cervical Spine Wo Contrast  03/15/2016  CLINICAL DATA:  Migraine headache and neck pain for 2 weeks. EXAM: CT HEAD WITHOUT CONTRAST CT CERVICAL SPINE WITHOUT CONTRAST TECHNIQUE: Multidetector CT imaging of the head and cervical spine was performed following the standard protocol without intravenous contrast. Multiplanar CT image reconstructions of the cervical spine were also generated. COMPARISON:  CT scan of Mar 26, 2011. FINDINGS: CT HEAD FINDINGS Bony calvarium appears intact. No mass effect or midline shift is noted. Ventricular size is within normal limits. There is no evidence of mass lesion, hemorrhage or acute infarction. CT CERVICAL SPINE FINDINGS No fracture or spondylolisthesis is noted. Mild anterior osteophyte formation is noted at C5-6 and C6-7. Disc spaces and posterior facet joints appear to be well maintained. Visualized lung apices are unremarkable. IMPRESSION: Normal head CT. Mild degenerative changes are noted in the cervical spine. No fracture or spondylolisthesis is noted. Electronically Signed   By: Lupita Raider, M.D.   On:  03/15/2016 14:00   I have personally reviewed and evaluated these images and lab results as part of my medical decision-making.   EKG Interpretation None      MDM   Final diagnoses:  Nonintractable headache, unspecified chronicity pattern, unspecified headache type  Neck pain    CT of the head and neck are normal. Discussed with the patient at length that the definitive tests for cervical disc disease was MRI. He has multiple piercings in his lip, and nose, and both ears. He declines politely removing them. I told him that I would have staff attempt to assist him  with this and he again politely declined saying he would have someone "that does it normally" do it.  I find no historical clues or physical exam findings that would suggest acute herniated nucleus. He is ambulatory. He is heel standing. He can toe stand. He has normal proprioception and sensation.    Rolland PorterMark Lonette Stevison, MD 03/15/16 1450

## 2016-11-21 ENCOUNTER — Emergency Department (HOSPITAL_COMMUNITY)
Admission: EM | Admit: 2016-11-21 | Discharge: 2016-11-21 | Disposition: A | Discharge status: Home / Self Care | Payer: Self-pay | Attending: Emergency Medicine | Admitting: Emergency Medicine

## 2016-11-21 ENCOUNTER — Emergency Department (HOSPITAL_COMMUNITY): Payer: Self-pay

## 2016-11-21 ENCOUNTER — Encounter (HOSPITAL_COMMUNITY): Payer: Self-pay

## 2016-11-21 DIAGNOSIS — R109 Unspecified abdominal pain: Secondary | ICD-10-CM | POA: Insufficient documentation

## 2016-11-21 LAB — CBC
HEMATOCRIT: 42.3 % (ref 39.0–52.0)
Hemoglobin: 15 g/dL (ref 13.0–17.0)
MCH: 29.7 pg (ref 26.0–34.0)
MCHC: 35.5 g/dL (ref 30.0–36.0)
MCV: 83.8 fL (ref 78.0–100.0)
PLATELETS: 191 10*3/uL (ref 150–400)
RBC: 5.05 MIL/uL (ref 4.22–5.81)
RDW: 12.9 % (ref 11.5–15.5)
WBC: 13 10*3/uL — AB (ref 4.0–10.5)

## 2016-11-21 LAB — COMPREHENSIVE METABOLIC PANEL
ALT: 24 U/L (ref 17–63)
ANION GAP: 8 (ref 5–15)
AST: 24 U/L (ref 15–41)
Albumin: 4.5 g/dL (ref 3.5–5.0)
Alkaline Phosphatase: 58 U/L (ref 38–126)
BILIRUBIN TOTAL: 0.6 mg/dL (ref 0.3–1.2)
BUN: 15 mg/dL (ref 6–20)
CO2: 24 mmol/L (ref 22–32)
Calcium: 9.2 mg/dL (ref 8.9–10.3)
Chloride: 108 mmol/L (ref 101–111)
Creatinine, Ser: 0.92 mg/dL (ref 0.61–1.24)
Glucose, Bld: 98 mg/dL (ref 65–99)
Potassium: 3.5 mmol/L (ref 3.5–5.1)
Sodium: 140 mmol/L (ref 135–145)
TOTAL PROTEIN: 7.3 g/dL (ref 6.5–8.1)

## 2016-11-21 LAB — URINALYSIS, ROUTINE W REFLEX MICROSCOPIC
BILIRUBIN URINE: NEGATIVE
Bacteria, UA: NONE SEEN
GLUCOSE, UA: NEGATIVE mg/dL
KETONES UR: NEGATIVE mg/dL
LEUKOCYTES UA: NEGATIVE
NITRITE: NEGATIVE
PROTEIN: NEGATIVE mg/dL
Specific Gravity, Urine: 1.005 (ref 1.005–1.030)
Squamous Epithelial / LPF: NONE SEEN
pH: 9 — ABNORMAL HIGH (ref 5.0–8.0)

## 2016-11-21 LAB — LIPASE, BLOOD: Lipase: 29 U/L (ref 11–51)

## 2016-11-21 MED ORDER — BACLOFEN 10 MG PO TABS: 10.0000 mg | ORAL_TABLET | Freq: Three times a day (TID) | ORAL | 0 refills | Status: AC

## 2016-11-21 MED ORDER — HYDROMORPHONE HCL 1 MG/ML IJ SOLN
1.0000 mg | Freq: Once | INTRAMUSCULAR | Status: AC
  Administered 2016-11-21: 1 mg via INTRAMUSCULAR
  Filled 2016-11-21: qty 1

## 2016-11-21 MED ORDER — MELOXICAM 15 MG PO TABS: 15.0000 mg | ORAL_TABLET | Freq: Every day | ORAL | 0 refills | Status: AC

## 2016-11-21 MED ORDER — ONDANSETRON 4 MG PO TBDP
4.0000 mg | ORAL_TABLET | Freq: Once | ORAL | Status: AC
  Administered 2016-11-21: 4 mg via ORAL
  Filled 2016-11-21: qty 1

## 2016-11-21 MED ORDER — OXYCODONE-ACETAMINOPHEN 5-325 MG PO TABS
1.0000 | ORAL_TABLET | ORAL | Status: DC | PRN
  Administered 2016-11-21: 1 via ORAL
  Filled 2016-11-21: qty 1

## 2016-11-21 NOTE — ED Provider Notes (Addendum)
[facility]-EMERGENCY DEPT Provider Note   CSN: [id number] Arrival date & time: 11/21/16  1325     History   Chief Complaint Chief Complaint  Patient presents with  . Flank Pain    BILATERAL    HPI Erik Dunlap is a 41 y.o. male with a pmh of renal calculi who presents with cc of BL Flank pain, Worse on the R flank pain. The patient c/o acute onset pain in the R flank radiating into his groin and testicle.  He has associated pressure and urinary urgency without dysuria.  He states that he has urinated a few time since his arrival to the ED and thinks that he may have passed the stone already, but still feels very achey on the R. He denies hematuria, nausea, vomting, fever, penile discharge, genital lesions.  HPI  Past Medical History:  Diagnosis Date  . ACL (anterior cruciate ligament) tear   . Anxiety   . Kidney calculi   . Kidney calculus     There are no active problems to display for this patient.   Past Surgical History:  Procedure Laterality Date  . APPENDECTOMY         Home Medications    Prior to Admission medications   Medication Sig Start Date End Date Taking? Authorizing Provider  clonazePAM (KLONOPIN) 1 MG tablet Take 1 mg by mouth daily as needed for anxiety. 10/17/16  Yes Historical Provider, MD  gabapentin (NEURONTIN) 300 MG capsule Take 300 mg by mouth at bedtime. 09/23/16  Yes Historical Provider, MD  Multiple Vitamins-Minerals (MULTIVITAMIN WITH MINERALS) tablet Take 1 tablet by mouth daily.   Yes Historical Provider, MD  baclofen (LIORESAL) 10 MG tablet Take 1 tablet (10 mg total) by mouth 3 (three) times daily. 11/21/16   Arthor CaptainAbigail Tevis Dunavan, PA-C  meloxicam (MOBIC) 15 MG tablet Take 1 tablet (15 mg total) by mouth daily. 11/21/16   Arthor CaptainAbigail Bevelyn Arriola, PA-C    Family History History reviewed. No pertinent family history.  Social History Social History  Substance Use Topics  . Smoking status: Never Smoker  . Smokeless tobacco: Never Used  . Alcohol use  Yes     Comment: socially     Allergies   Fentanyl   Review of Systems Review of Systems  Ten systems reviewed and are negative for acute change, except as noted in the HPI.   Physical Exam Updated Vital Signs BP 143/89 (BP Location: Right Arm)   Pulse 63   Temp 98.1 F (36.7 C) (Oral)   Resp 16   Ht 5\' 8"  (1.727 m)   Wt 90.7 kg   SpO2 100%   BMI 30.41 kg/m   Physical Exam  Physical Exam  Nursing note and vitals reviewed. Constitutional: He appears well-developed and well-nourished. No distress.  HENT:  Head: Normocephalic and atraumatic.  Eyes: Conjunctivae normal are normal. No scleral icterus.  Neck: Normal range of motion. Neck supple.  Cardiovascular: Normal rate, regular rhythm and normal heart sounds.   Pulmonary/Chest: Effort normal and breath sounds normal. No respiratory distress.  Abdominal: Soft. There is no tenderness.  Musculoskeletal: He exhibits no edema.  Neurological: He is alert.  Skin: Skin is warm and dry. He is not diaphoretic.  Psychiatric: His behavior is normal.    ED Treatments / Results  Labs (all labs ordered are listed, but only abnormal results are displayed) Labs Reviewed  URINALYSIS, ROUTINE W REFLEX MICROSCOPIC - Abnormal; Notable for the following:       Result Value  Color, Urine STRAW (*)    pH 9.0 (*)    Hgb urine dipstick SMALL (*)    All other components within normal limits  CBC - Abnormal; Notable for the following:    WBC 13.0 (*)    All other components within normal limits  LIPASE, BLOOD  COMPREHENSIVE METABOLIC PANEL    EKG  EKG Interpretation None       Radiology Ct Renal Stone Study  Result Date: 11/21/2016 CLINICAL DATA:  Patient with bilateral flank pain radiating to the lower abdomen. Prior history of renal stones. EXAM: CT ABDOMEN AND PELVIS WITHOUT CONTRAST TECHNIQUE: Multidetector CT imaging of the abdomen and pelvis was performed following the standard protocol without IV contrast.  COMPARISON:  CT abdomen pelvis 09/11/2011. FINDINGS: Lower chest: Normal heart size. Dependent atelectasis within the bilateral lower lobes. No pleural effusion. Hepatobiliary: Liver is normal in size and contour. Gallbladder is decompressed. No intrahepatic or extrahepatic biliary ductal dilatation. Pancreas: Unremarkable Spleen: Unremarkable Adrenals/Urinary Tract: The adrenal glands are normal. No ureterolithiasis. No hydronephrosis. Bilateral nonobstructing renal stones including a 4 mm stone within the superior pole of the right kidney and a 4 mm stone within the interpolar region of the left kidney. Urinary bladder is unremarkable. Stomach/Bowel: No abnormal bowel wall thickening or evidence for bowel obstruction. No free fluid or free intraperitoneal air. Vascular/Lymphatic: Normal caliber abdominal aorta. No retroperitoneal lymphadenopathy. Reproductive: Central dystrophic calcifications in the prostate. Other: None. Musculoskeletal: Lumbar spine degenerative changes. No aggressive or acute appearing osseous lesions. IMPRESSION: No acute process within the abdomen or pelvis. Bilateral nonobstructing nephrolithiasis. Electronically Signed   By: Annia Belt M.D.   On: 11/21/2016 18:09    Procedures Procedures (including critical care time)  Medications Ordered in ED Medications  ondansetron (ZOFRAN-ODT) disintegrating tablet 4 mg (4 mg Oral Given 11/21/16 1848)  HYDROmorphone (DILAUDID) injection 1 mg (1 mg Intramuscular Given 11/21/16 1849)     Initial Impression / Assessment and Plan / ED Course  I have reviewed the triage vital signs and the nursing notes.  Pertinent labs & imaging results that were available during my care of the patient were reviewed by me and considered in my medical decision making (see chart for details).     Patient without acute findings on CT.  I spoke with Dr. Earlene Plater regarding the findings of "Central dystrophic calcifications in the prostate." He states that this  does not represent scarring from untreated prostatitis and is a nonspecific finding that does not require specific follow up. Patient will be discharged home with baclofen and mobic for muscle pain. The patient appears reasonably screened and/or stabilized for discharge and I doubt any other medical condition or other [facility] requiring further screening, evaluation, or treatment in the ED at this time prior to discharge.   Final Clinical Impressions(s) / ED Diagnoses   Final diagnoses:  Flank pain    New Prescriptions Discharge Medication List as of 11/21/2016  7:06 PM    START taking these medications   Details  baclofen (LIORESAL) 10 MG tablet Take 1 tablet (10 mg total) by mouth 3 (three) times daily., Starting Thu 11/21/2016, Print    meloxicam (MOBIC) 15 MG tablet Take 1 tablet (15 mg total) by mouth daily., Starting Thu 11/21/2016, Print         [place], PA-C 11/24/16 4098    Arby Barrette, MD 11/26/16 1555    Arthor Captain, PA-C 12/02/16 1619    Arby Barrette, MD 12/18/16 1147

## 2016-11-21 NOTE — Discharge Instructions (Signed)
Your imaging was negative. Please use the medications I have provided for pain and spasm relief.  There does not seem to be an acute or emergent cause of your pain today. SEEK IMMEDIATE MEDICAL ATTENTION IF: New numbness, tingling, weakness, or problem with the use of your arms or legs.  Severe back pain not relieved with medications.  Change in bowel or bladder control.  Increasing pain in any areas of the body (such as chest or abdominal pain).  Shortness of breath, dizziness or fainting.  Nausea (feeling sick to your stomach), vomiting, fever, or sweats.

## 2016-11-21 NOTE — ED Triage Notes (Signed)
PT RECEIVED FROM HOME VIA EMS C/O BILATERAL FLANK PAIN RADIATING TO THE LOWER ABDOMEN. PER EMS, THE PT HAS A HX OF KIDNEY STONES AND THERE ARE A COUPLE THAT ARE BECOMING PROBLEMATIC FOR HIM.

## 2016-11-21 NOTE — ED Notes (Signed)
Patient d/c'd self care.  F/U and medications reviewed.  Patient verbalized understanding. 

## 2019-01-14 ENCOUNTER — Other Ambulatory Visit: Payer: Self-pay | Admitting: Urology

## 2019-01-15 ENCOUNTER — Encounter (HOSPITAL_COMMUNITY): Payer: Self-pay

## 2019-01-15 NOTE — Patient Instructions (Addendum)
MURRELL LECCE  01/15/2019       Your procedure is scheduled on:   01-19-2019   Report to [facility] Main  Entrance,  Report to admitting at  6:30 AM    Call this number if you have problems the morning of surgery [phone]        Remember: Do not eat food or drink liquids :After Midnight.   This includes no water, candy, gum, mints   BRUSH YOUR TEETH MORNING OF SURGERY AND RINSE YOUR MOUTH OUT          Take these medicines the morning of surgery with A SIP OF WATER:   Amlodipine (norvasc),  Tamsulosin (flomax) ,  Oxycodone/ Zofran if needed                                    You may not have any metal on your body including piercings              Do not wear jewelry, lotions, powders or perfumes, deodorant                          Men may shave face and neck.       Do not bring valuables to the hospital. [facility] IS NOT             RESPONSIBLE   FOR VALUABLES.  Contacts, dentures or bridgework may not be worn into surgery.  Leave suitcase in the car. After surgery it may be brought to your room.         Patients discharged the day of surgery will not be allowed to drive home. IF YOU ARE HAVING SURGERY AND GOING HOME THE SAME DAY, YOU MUST HAVE AN ADULT TO DRIVE YOU HOME AND BE WITH YOU FOR 24 HOURS. YOU MAY GO HOME BY TAXI OR UBER OR ORTHERWISE, BUT AN ADULT MUST ACCOMPANY YOU HOME AND STAY WITH YOU FOR 24 HOURS.   Name and phone number of your driver:                 _____________________________________________________________________             [facility] - Preparing for Surgery Before surgery, you can play an important role.  Because skin is not sterile, your skin needs to be as free of germs as possible.  You can reduce the number of germs on your skin by washing with CHG (chlorahexidine gluconate) soap before surgery.  CHG is an antiseptic cleaner which kills germs and bonds with the skin to continue killing  germs even after washing. Please DO NOT use if you have an allergy to CHG or antibacterial soaps.  If your skin becomes reddened/irritated stop using the CHG and inform your nurse when you arrive at Short Stay. Do not shave (including legs and underarms) for at least 48 hours prior to the first CHG shower.  You may shave your face/neck. Please follow these instructions carefully:  1.  Shower with CHG Soap the night before surgery and the  morning of Surgery.  2.  If you choose to wash your hair, wash your hair first as usual with your  normal  shampoo.  3.  After you shampoo, rinse your hair and body thoroughly to remove the  shampoo.  4.  Use CHG as you would any other liquid soap.  You can apply chg directly  to the skin and wash                       Gently with a scrungie or clean washcloth.  5.  Apply the CHG Soap to your body ONLY FROM THE NECK DOWN.   Do not use on face/ open                           Wound or open sores. Avoid contact with eyes, ears mouth and genitals (private parts).                       Wash face,  Genitals (private parts) with your normal soap.             6.  Wash thoroughly, paying special attention to the area where your surgery  will be performed.  7.  Thoroughly rinse your body with warm water from the neck down.  8.  DO NOT shower/wash with your normal soap after using and rinsing off  the CHG Soap.             9.  Pat yourself dry with a clean towel.            10.  Wear clean pajamas.            11.  Place clean sheets on your bed the night of your first shower and do not  sleep with pets. Day of Surgery : Do not apply any lotions/deodorants the morning of surgery.  Please wear clean clothes to the hospital/surgery center.  FAILURE TO FOLLOW THESE INSTRUCTIONS MAY RESULT IN THE CANCELLATION OF YOUR SURGERY PATIENT SIGNATURE_________________________________  NURSE  SIGNATURE__________________________________  ________________________________________________________________________

## 2019-01-18 ENCOUNTER — Encounter (HOSPITAL_COMMUNITY)
Admission: RE | Admit: 2019-01-18 | Discharge: 2019-01-18 | Disposition: A | Discharge status: Home / Self Care | Payer: Self-pay | Source: Ambulatory Visit | Attending: Urology | Admitting: Urology

## 2019-01-18 ENCOUNTER — Other Ambulatory Visit: Payer: Self-pay

## 2019-01-18 ENCOUNTER — Encounter (HOSPITAL_COMMUNITY): Payer: Self-pay

## 2019-01-18 DIAGNOSIS — Z01818 Encounter for other preprocedural examination: Secondary | ICD-10-CM | POA: Insufficient documentation

## 2019-01-18 DIAGNOSIS — N211 Calculus in urethra: Secondary | ICD-10-CM | POA: Insufficient documentation

## 2019-01-18 HISTORY — DX: Calculus of ureter: N20.1

## 2019-01-18 HISTORY — DX: Dysuria: R30.0

## 2019-01-18 HISTORY — DX: Presence of spectacles and contact lenses: Z97.3

## 2019-01-18 HISTORY — DX: Unspecified osteoarthritis, unspecified site: M19.90

## 2019-01-18 HISTORY — DX: Hematuria, unspecified: R31.9

## 2019-01-18 HISTORY — DX: Essential (primary) hypertension: I10

## 2019-01-18 HISTORY — DX: Frequency of micturition: R35.0

## 2019-01-18 HISTORY — DX: Calculus of kidney: N20.0

## 2019-01-18 HISTORY — DX: Unspecified abdominal pain: R10.9

## 2019-01-18 HISTORY — DX: Personal history of urinary calculi: Z87.442

## 2019-01-18 HISTORY — DX: Constipation, unspecified: K59.00

## 2019-01-18 LAB — CBC
HEMATOCRIT: 46.3 % (ref 39.0–52.0)
Hemoglobin: 15.8 g/dL (ref 13.0–17.0)
MCH: 29.8 pg (ref 26.0–34.0)
MCHC: 34.1 g/dL (ref 30.0–36.0)
MCV: 87.4 fL (ref 80.0–100.0)
NRBC: 0 % (ref 0.0–0.2)
Platelets: 185 10*3/uL (ref 150–400)
RBC: 5.3 MIL/uL (ref 4.22–5.81)
RDW: 12.3 % (ref 11.5–15.5)
WBC: 6 10*3/uL (ref 4.0–10.5)

## 2019-01-18 LAB — BASIC METABOLIC PANEL
ANION GAP: 10 (ref 5–15)
BUN: 10 mg/dL (ref 6–20)
CHLORIDE: 105 mmol/L (ref 98–111)
CO2: 25 mmol/L (ref 22–32)
Calcium: 9.1 mg/dL (ref 8.9–10.3)
Creatinine, Ser: 0.82 mg/dL (ref 0.61–1.24)
GFR calc non Af Amer: >60 mL/min (ref >60)
Glucose, Bld: 98 mg/dL (ref 70–99)
Potassium: 3.7 mmol/L (ref 3.5–5.1)
Sodium: 140 mmol/L (ref 135–145)

## 2019-01-18 NOTE — Anesthesia Preprocedure Evaluation (Addendum)
Anesthesia Evaluation    Airway Mallampati: II  TM Distance: >3 FB Neck ROM: Full    Dental no notable dental hx.    Pulmonary    Pulmonary exam normal breath sounds clear to auscultation       Cardiovascular hypertension, Normal cardiovascular exam Rhythm:Regular Rate:Normal     Neuro/Psych    GI/Hepatic   Endo/Other    Renal/GU      Musculoskeletal   Abdominal   Peds  Hematology   Anesthesia Other Findings   Reproductive/Obstetrics                            Anesthesia Physical Anesthesia Plan  ASA: II  Anesthesia Plan: General   Post-op Pain Management:    Induction: Intravenous  PONV Risk Score and Plan: 2 and Ondansetron and Treatment may vary due to age or medical condition  Airway Management Planned: Oral ETT and LMA  Additional Equipment:   Intra-op Plan:   Post-operative Plan: Extubation in OR  Informed Consent: I have reviewed the patients History and Physical, chart, labs and discussed the procedure including the risks, benefits and alternatives for the proposed anesthesia with the patient or authorized representative who has indicated his/her understanding and acceptance.     Dental advisory given  Plan Discussed with: CRNA, Anesthesiologist and Surgeon  Anesthesia Plan Comments: (  Allergies    FENTANYL ~ anxiety )        Anesthesia Quick Evaluation

## 2019-01-18 NOTE — Progress Notes (Signed)
Anesthesia Chart Review   Case:  [id number] Date/Time:  01/19/19 0820   Procedure:  URETEROSCOPY WITH HOLMIUM LASER LITHOTRIPSY/ STONE EXTRACTION (Left )   Anesthesia type:  General   Pre-op diagnosis:  LEFT URETERAL STONE   Location:  [facility] PROCEDURE ROOM / [facility]   Surgeon:  Rene Paci, MD      DISCUSSION: 43 yo never smoker with h/o anxiety, HTN, left ureteral stone scheduled for above procedure 01/19/19 with Dr. Cristal Deer Liliane Shi.   Pt can proceed with planned procedure barring acute status change.  VS: BP 131/83   Pulse 67   Temp 36.9 C (Oral)   Resp 14   Ht 5\' 8"  (1.727 m)   Wt 83.2 kg   SpO2 97%   BMI 27.90 kg/m   PROVIDERS: Patient, No Pcp Per   LABS: Labs reviewed: Acceptable for surgery. (all labs ordered are listed, but only abnormal results are displayed)  Labs Reviewed  BASIC METABOLIC PANEL  CBC     IMAGES:   EKG: 01/18/19 Rate 70 bpm Normal sinus rhythm  Normal ECG   CV:  Past Medical History:  Diagnosis Date  . ACL (anterior cruciate ligament) tear   . Anxiety   . Arthritis    neck  . Constipation   . Dysuria   . Frequency of urination   . Hematuria   . History of kidney stones   . Hypertension   . Left flank pain   . Nephrolithiasis   . Ureteral calculus, left   . Wears glasses     Past Surgical History:  Procedure Laterality Date  . ANTERIOR CRUCIATE LIGAMENT REPAIR Left 2016  . LAPAROSCOPIC APPENDECTOMY  01-16-2006    dr Johna Sheriff @[facility]     MEDICATIONS: . amLODipine (NORVASC) 5 MG tablet  . baclofen (LIORESAL) 10 MG tablet  . gabapentin (NEURONTIN) 300 MG capsule  . ibuprofen (ADVIL,MOTRIN) 400 MG tablet  . Lidocaine-Menthol (ICY HOT LIDOCAINE PLUS MENTHOL) 4-1 % CREA  . meloxicam (MOBIC) 15 MG tablet  . ondansetron (ZOFRAN-ODT) 4 MG disintegrating tablet  . OVER THE COUNTER MEDICATION  . oxyCODONE-acetaminophen (PERCOCET/ROXICET) 5-325 MG tablet  . tamsulosin (FLOMAX) 0.4 MG CAPS capsule  . traZODone  (DESYREL) 100 MG tablet   No current facility-administered medications for this encounter.    Janey Genta [facility] Pre-Surgical Testing [phone] 01/18/19 3:09 PM

## 2019-01-19 ENCOUNTER — Ambulatory Visit (HOSPITAL_COMMUNITY): Payer: Self-pay | Admitting: Anesthesiology

## 2019-01-19 ENCOUNTER — Ambulatory Visit (HOSPITAL_COMMUNITY): Payer: Self-pay

## 2019-01-19 ENCOUNTER — Encounter (HOSPITAL_COMMUNITY): Payer: Self-pay

## 2019-01-19 ENCOUNTER — Ambulatory Visit (HOSPITAL_COMMUNITY): Payer: Self-pay | Admitting: Physician Assistant

## 2019-01-19 ENCOUNTER — Ambulatory Visit (HOSPITAL_COMMUNITY)
Admission: RE | Admit: 2019-01-19 | Discharge: 2019-01-19 | Disposition: A | Discharge status: Home / Self Care | Payer: Self-pay | Attending: Urology | Admitting: Urology

## 2019-01-19 ENCOUNTER — Encounter (HOSPITAL_COMMUNITY)
Admission: RE | Disposition: A | Discharge status: Home / Self Care | Payer: Self-pay | Source: Home / Self Care | Attending: Urology

## 2019-01-19 DIAGNOSIS — I10 Essential (primary) hypertension: Secondary | ICD-10-CM | POA: Insufficient documentation

## 2019-01-19 DIAGNOSIS — Z79899 Other long term (current) drug therapy: Secondary | ICD-10-CM | POA: Insufficient documentation

## 2019-01-19 DIAGNOSIS — Z87442 Personal history of urinary calculi: Secondary | ICD-10-CM | POA: Insufficient documentation

## 2019-01-19 DIAGNOSIS — N132 Hydronephrosis with renal and ureteral calculous obstruction: Secondary | ICD-10-CM | POA: Insufficient documentation

## 2019-01-19 DIAGNOSIS — N200 Calculus of kidney: Secondary | ICD-10-CM

## 2019-01-19 HISTORY — PX: URETEROSCOPY WITH HOLMIUM LASER LITHOTRIPSY: SHX6645

## 2019-01-19 SURGERY — URETEROSCOPY, WITH LITHOTRIPSY USING HOLMIUM LASER
Anesthesia: General | Site: Urethra | Laterality: Left

## 2019-01-19 MED ORDER — LIDOCAINE 2% (20 MG/ML) 5 ML SYRINGE
INTRAMUSCULAR | Status: DC | PRN
  Administered 2019-01-19: 60 mg via INTRAVENOUS

## 2019-01-19 MED ORDER — FENTANYL CITRATE (PF) 100 MCG/2ML IJ SOLN
INTRAMUSCULAR | Status: AC
  Filled 2019-01-19: qty 2

## 2019-01-19 MED ORDER — IOHEXOL 300 MG/ML  SOLN
INTRAMUSCULAR | Status: DC | PRN
  Administered 2019-01-19: 8.5 mL via INTRAVENOUS

## 2019-01-19 MED ORDER — LIDOCAINE 2% (20 MG/ML) 5 ML SYRINGE
INTRAMUSCULAR | Status: AC
  Filled 2019-01-19: qty 5

## 2019-01-19 MED ORDER — OXYCODONE HCL 5 MG PO TABS
5.0000 mg | ORAL_TABLET | Freq: Once | ORAL | Status: AC | PRN
  Administered 2019-01-19: 5 mg via ORAL

## 2019-01-19 MED ORDER — ONDANSETRON HCL 4 MG/2ML IJ SOLN: 4.0000 mg | Freq: Once | INTRAMUSCULAR | Status: DC | PRN

## 2019-01-19 MED ORDER — FENTANYL CITRATE (PF) 100 MCG/2ML IJ SOLN: 25.0000 ug | INTRAMUSCULAR | Status: DC | PRN

## 2019-01-19 MED ORDER — FENTANYL CITRATE (PF) 100 MCG/2ML IJ SOLN
INTRAMUSCULAR | Status: DC | PRN
  Administered 2019-01-19: 75 ug via INTRAVENOUS
  Administered 2019-01-19: 25 ug via INTRAVENOUS

## 2019-01-19 MED ORDER — MIDAZOLAM HCL 2 MG/2ML IJ SOLN
INTRAMUSCULAR | Status: AC
  Filled 2019-01-19: qty 2

## 2019-01-19 MED ORDER — LACTATED RINGERS IV SOLN
INTRAVENOUS | Status: DC
  Administered 2019-01-19: 07:00:00 via INTRAVENOUS

## 2019-01-19 MED ORDER — ONDANSETRON HCL 4 MG/2ML IJ SOLN
INTRAMUSCULAR | Status: DC | PRN
  Administered 2019-01-19: 4 mg via INTRAVENOUS

## 2019-01-19 MED ORDER — MIDAZOLAM HCL 5 MG/5ML IJ SOLN
INTRAMUSCULAR | Status: DC | PRN
  Administered 2019-01-19: 2 mg via INTRAVENOUS

## 2019-01-19 MED ORDER — OXYCODONE HCL 5 MG PO TABS
ORAL_TABLET | ORAL | Status: AC
  Filled 2019-01-19: qty 1

## 2019-01-19 MED ORDER — MIDAZOLAM HCL 2 MG/2ML IJ SOLN
1.0000 mg | INTRAMUSCULAR | Status: DC
  Administered 2019-01-19: 2 mg via INTRAVENOUS
  Filled 2019-01-19: qty 2

## 2019-01-19 MED ORDER — SODIUM CHLORIDE 0.9 % IR SOLN
Status: DC | PRN
  Administered 2019-01-19: 3000 mL

## 2019-01-19 MED ORDER — MEPERIDINE HCL 50 MG/ML IJ SOLN: 6.2500 mg | INTRAMUSCULAR | Status: DC | PRN

## 2019-01-19 MED ORDER — PROPOFOL 10 MG/ML IV BOLUS
INTRAVENOUS | Status: DC | PRN
  Administered 2019-01-19: 200 mg via INTRAVENOUS

## 2019-01-19 MED ORDER — KETOROLAC TROMETHAMINE 30 MG/ML IJ SOLN
INTRAMUSCULAR | Status: AC
  Filled 2019-01-19: qty 1

## 2019-01-19 MED ORDER — ACETAMINOPHEN 325 MG PO TABS: 325.0000 mg | ORAL_TABLET | ORAL | Status: DC | PRN

## 2019-01-19 MED ORDER — DEXAMETHASONE SODIUM PHOSPHATE 10 MG/ML IJ SOLN
INTRAMUSCULAR | Status: DC | PRN
  Administered 2019-01-19: 8 mg via INTRAVENOUS

## 2019-01-19 MED ORDER — OXYCODONE-ACETAMINOPHEN 5-325 MG PO TABS: 0.5000 | ORAL_TABLET | Freq: Four times a day (QID) | ORAL | 0 refills | Status: AC | PRN

## 2019-01-19 MED ORDER — PROPOFOL 10 MG/ML IV BOLUS
INTRAVENOUS | Status: AC
  Filled 2019-01-19: qty 40

## 2019-01-19 MED ORDER — ACETAMINOPHEN 160 MG/5ML PO SOLN: 325.0000 mg | ORAL | Status: DC | PRN

## 2019-01-19 MED ORDER — OXYCODONE HCL 5 MG/5ML PO SOLN: 5.0000 mg | Freq: Once | ORAL | Status: AC | PRN

## 2019-01-19 MED ORDER — KETOROLAC TROMETHAMINE 30 MG/ML IJ SOLN
INTRAMUSCULAR | Status: DC | PRN
  Administered 2019-01-19: 30 mg via INTRAVENOUS

## 2019-01-19 MED ORDER — CEFAZOLIN SODIUM-DEXTROSE 2-4 GM/100ML-% IV SOLN
2.0000 g | Freq: Once | INTRAVENOUS | Status: AC
  Administered 2019-01-19: 2 g via INTRAVENOUS
  Filled 2019-01-19: qty 100

## 2019-01-19 SURGICAL SUPPLY — 26 items
APL SKNCLS STERI-STRIP NONHPOA (GAUZE/BANDAGES/DRESSINGS) ×1
BAG URO CATCHER STRL LF (MISCELLANEOUS) ×3 IMPLANT
BASKET ZERO TIP NITINOL 2.4FR (BASKET) ×2 IMPLANT
BENZOIN TINCTURE PRP APPL 2/3 (GAUZE/BANDAGES/DRESSINGS) ×2 IMPLANT
BSKT STON RTRVL ZERO TP 2.4FR (BASKET) ×1
CATH URET 5FR 28IN OPEN ENDED (CATHETERS) ×3 IMPLANT
CLOSURE WOUND 1/2 X4 (GAUZE/BANDAGES/DRESSINGS) ×1
CLOTH BEACON ORANGE TIMEOUT ST (SAFETY) ×3 IMPLANT
COVER WAND RF STERILE (DRAPES) IMPLANT
EXTRACTOR STONE NITINOL NGAGE (UROLOGICAL SUPPLIES) IMPLANT
FIBER LASER FLEXIVA 365 (UROLOGICAL SUPPLIES) IMPLANT
FIBER LASER TRAC TIP (UROLOGICAL SUPPLIES) ×2 IMPLANT
GLOVE BIOGEL M STRL SZ7.5 (GLOVE) ×3 IMPLANT
GOWN STRL REUS W/TWL XL LVL3 (GOWN DISPOSABLE) ×3 IMPLANT
GUIDEWIRE STR DUAL SENSOR (WIRE) IMPLANT
GUIDEWIRE ZIPWRE .038 STRAIGHT (WIRE) ×3 IMPLANT
KIT TURNOVER KIT A (KITS) IMPLANT
MANIFOLD NEPTUNE II (INSTRUMENTS) ×3 IMPLANT
PACK CYSTO (CUSTOM PROCEDURE TRAY) ×3 IMPLANT
SHEATH URETERAL 12FRX35CM (MISCELLANEOUS) IMPLANT
STENT URET 6FRX24 CONTOUR (STENTS) ×2 IMPLANT
STENT URET 6FRX26 CONTOUR (STENTS) IMPLANT
STRIP CLOSURE SKIN 1/2X4 (GAUZE/BANDAGES/DRESSINGS) ×1 IMPLANT
TUBING CONNECTING 10 (TUBING) ×2 IMPLANT
TUBING CONNECTING 10' (TUBING) ×1
TUBING UROLOGY SET (TUBING) ×3 IMPLANT

## 2019-01-19 NOTE — Op Note (Signed)
Preoperative Diagnosis: Left nephrolithiasis  Postoperative Diagnosis:  Same  Procedure(s) Performed:   - Cystourethroscopy - Left ureteroscopic stone extraction with laser lithotripsy - Left retrograde pyelogram - Left ureteral stent placement - Intraoperative fluoroscopy with interpretation <1hr.   Teaching Surgeon:  Rhoderick Moody, MD  Resident Surgeon:  Lenetta Quaker, MD  Assistant(s):  None  Anesthesia:  General  Fluids:  See anesthesia record  Estimated blood loss:  0 cc  Specimens:  Stone for analysis  Drains:  Left 6 Fr x 24 cm JJ ureteral stent with dangler  Complications:  None  Indications: 43 y.o. patient with a history of a 7 mm left mid ureteral stone, as well as small bilateral nonobstructive stones. Risks & benefits of the procedure discussed with the patient, who wishes to proceed.  Findings:   1. 6 mm mid ureteral stone - laser treatment and basket extraction 2. Several small nonobstructive left renal stones - treated with laser dusting 3. Temporary stent placement with string  Radiologic Interpretation of Retrograde Pyelogram: Left retrograde pyelogram demonstrated no contrast extravasation. There was a small filling defect within mid ureter consistent with known stone location.   Description:  The patient was correctly identified in the preop holding area where written informed consent as well potential risk and complication reviewed. The patient agreed. They were brought back to the operative suite where a preinduction timeout was performed. Once correct information was verified, general anesthesia was induced. They were then gently placed into dorsal lithotomy position with SCDs in place for VTE prophylaxis. They were prepped and draped in the usual sterile fashion and given appropriate preoperative antibiotics. A second timeout was then performed.   We inserted a 85F rigid cystoscope per urethra with copious lubrication and normal saline  irrigation running. This demonstrated findings as described above.    We cannulated the left ureteral orifice with the combination of a sensor wire and 5Fr open ended catheter and the sensor wire was advanced into the expected location of the renal pelvis without difficulty. We were able to visualize a radioopacity on fluoroscopy. The 5Fr open ended catheter was advanced into the distal ureter and a retrograde pyelogram was performed with findings as noted above. We then replaced the sensor wire into the right kidney and the 5Fr open ended catheter was removed. We then removed the cystoscope leaving our sensor wire in place.  We transitioned to a semi-rigid ureteroscope which was used to fully fragment a mid ureteral stone. The fragments were extracted with a zero tip wire basket. Following this, we transitioned to a flexible ureteroscope and complete ureteropyeloscopy was performed. There were several small nonobstructive left renal stones which were subsequently treated with laser lithotripsy in a dusting fashion. All fragments were small and left to pass spontaneously. A retrograde pyelogram through the scope was performed which delineated the renal pelvis. No contrast extravasation was noted.  At this point we elected to leave a ureteral stent and withdrew our instruments leaving a sensor wire in place. We then advanced a 6Fr x 24cm JJ ureteral stent with dangler with the assistance of a stent pusher under direct fluoroscopic & visual guidance without difficultly. Sensor wire removal demonstrated satisfactory stent curl proximally in the renal pelvis and distally in the bladder. The bladder was emptied, prior stones were collected, and all instrumentation was removed. The stent string was cut to an appropriate length. The patient was woken up from anesthesia and taken to the recovery unit for routine postoperative care.  Post Op Plan:   1. Remove stent at home on Monday 01/25/19 2. RTC in 6-8 weeks  with RBUS

## 2019-01-19 NOTE — H&P (Signed)
Urology Preoperative H&P   Chief Complaint: Left flank pain   History of Present Illness: Erik Dunlap is a 43 y.o. male with a history of kidney stones.  He presented to the office in early March with intermittent episodes of sharp, nonradiating left-sided flank pain.  CT stone study from 12/29/2018 revealed a 7 mm left mid ureteral calculus with mild hydroureteronephrosis and perinephric stranding.  Follow-up [facility] on 01/07/2019 showed no resolution of his stone burden within the left ureter.  He continues to have intermittent episodes of left-sided flank pain.  He denies nausea/vomiting, fever/chills, dysuria or gross hematuria.    Past Medical History:  Diagnosis Date  . ACL (anterior cruciate ligament) tear   . Anxiety   . Arthritis    neck  . Constipation   . Dysuria   . Frequency of urination   . Hematuria   . History of kidney stones   . Hypertension   . Left flank pain   . Nephrolithiasis   . Ureteral calculus, left   . Wears glasses     Past Surgical History:  Procedure Laterality Date  . ANTERIOR CRUCIATE LIGAMENT REPAIR Left 2016  . LAPAROSCOPIC APPENDECTOMY  01-16-2006    dr Johna Sheriff @[facility]     Allergies:  Allergies  Allergen Reactions  . Fentanyl Other (See Comments)    PANIC ATTACK    History reviewed. No pertinent family history.  Social History:  reports that he has never smoked. He has never used smokeless tobacco. He reports previous alcohol use. He reports current drug use. Drug: Marijuana.  ROS: A complete review of systems was performed.  All systems are negative except for pertinent findings as noted.  Physical Exam:  Vital signs in last 24 hours: Temp:  [98.2 F (36.8 C)-98.4 F (36.9 C)] 98.2 F (36.8 C) (03/24 0614) Pulse Rate:  [67-73] 73 (03/24 0614) Resp:  [14] 14 (03/24 0614) BP: (131-133)/(83-89) 133/89 (03/24 0614) SpO2:  [97 %-98 %] 98 % (03/24 0614) Weight:  [83.2 kg] 83.2 kg (03/24 8638) Constitutional:  Alert and oriented, No acute  distress Cardiovascular: Regular rate and rhythm, No JVD Respiratory: Normal respiratory effort, Lungs clear bilaterally GI: Abdomen is soft, nontender, nondistended, no abdominal masses GU: No CVA tenderness Lymphatic: No lymphadenopathy Neurologic: Grossly intact, no focal deficits Psychiatric: Normal mood and affect  Laboratory Data:  Recent Labs    01/18/19 1421  WBC 6.0  HGB 15.8  HCT 46.3  PLT 185    Recent Labs    01/18/19 1421  NA 140  K 3.7  CL 105  GLUCOSE 98  BUN 10  CALCIUM 9.1  CREATININE 0.82     Results for orders placed or performed during the hospital encounter of 01/18/19 (from the past 24 hour(s))  Basic metabolic panel     Status: None   Collection Time: 01/18/19  2:21 PM  Result Value Ref Range   Sodium 140 135 - 145 mmol/L   Potassium 3.7 3.5 - 5.1 mmol/L   Chloride 105 98 - 111 mmol/L   CO2 25 22 - 32 mmol/L   Glucose, Bld 98 70 - 99 mg/dL   BUN 10 6 - 20 mg/dL   Creatinine, Ser 1.77 0.61 - 1.24 mg/dL   Calcium 9.1 8.9 - 11.6 mg/dL   GFR calc non Af Amer >60 >60 mL/min   GFR calc Af Amer >60 >60 mL/min   Anion gap 10 5 - 15  CBC     Status: None  Collection Time: 01/18/19  2:21 PM  Result Value Ref Range   WBC 6.0 4.0 - 10.5 K/uL   RBC 5.30 4.22 - 5.81 MIL/uL   Hemoglobin 15.8 13.0 - 17.0 g/dL   HCT 37.1 06.2 - 69.4 %   MCV 87.4 80.0 - 100.0 fL   MCH 29.8 26.0 - 34.0 pg   MCHC 34.1 30.0 - 36.0 g/dL   RDW 85.4 62.7 - 03.5 %   Platelets 185 150 - 400 K/uL   nRBC 0.0 0.0 - 0.2 %   No results found for this or any previous visit (from the past 240 hour(s)).  Renal Function: Recent Labs    01/18/19 1421  CREATININE 0.82   Estimated Creatinine Clearance: 120.8 mL/min (by C-G formula based on SCr of 0.82 mg/dL).  Radiologic Imaging: No results found.  I independently reviewed the above imaging studies.  Assessment and Plan Erik Dunlap is a 43 y.o. male with a 7 mm obstructing left mid ureteral stone with  hydronephrosis  The risks, benefits and alternatives of cystoscopy with LEFT ureteroscopy, laser lithotripsy and ureteral stent placement was discussed the patient.  Risks included, but are not limited to: bleeding, urinary tract infection, ureteral injury/avulsion, ureteral stricture formation, retained stone fragments, the possibility that multiple surgeries may be required to treat the stone(s), MI, stroke, PE and the inherent risks of general anesthesia.  The patient voices understanding and wishes to proceed.      Rhoderick Moody, MD 01/19/2019, 7:28 AM  [facility] Pager: [phone]

## 2019-01-19 NOTE — Anesthesia Postprocedure Evaluation (Signed)
Anesthesia Post Note  Patient: Erik Dunlap  Procedure(s) Performed: CYSTOSCOPY LEFT URETEROSCOPY WITH HOLMIUM LASER LITHOTRIPSY/ STONE EXTRACTION/LEFT URETERAL STENT PLACEMENT (Left Urethra)     Anesthesia Type: General    Last Vitals:  Vitals:   01/19/19 1030 01/19/19 1049  BP: (!) 133/92 136/89  Pulse: 75 73  Resp: 12 14  Temp: 36.6 C (!) 36.1 C  SpO2: 99% 100%    Last Pain:  Vitals:   01/19/19 1049  TempSrc:   PainSc: 4                  Thersa Mohiuddin

## 2019-01-19 NOTE — Transfer of Care (Signed)
Immediate Anesthesia Transfer of Care Note  Patient: Erik Dunlap  Procedure(s) Performed: Procedure(s): CYSTOSCOPY LEFT URETEROSCOPY WITH HOLMIUM LASER LITHOTRIPSY/ STONE EXTRACTION/LEFT URETERAL STENT PLACEMENT (Left)  Patient Location: PACU  Anesthesia Type:General  Level of Consciousness:  sedated, patient cooperative and responds to stimulation  Airway & Oxygen Therapy:Patient Spontanous Breathing and Patient connected to face mask oxgen  Post-op Assessment:  Report given to PACU RN and Post -op Vital signs reviewed and stable  Post vital signs:  Reviewed and stable  Last Vitals:  Vitals:   01/19/19 0614  BP: 133/89  Pulse: 73  Resp: 14  Temp: 36.8 C  SpO2: 98%    Complications: No apparent anesthesia complications

## 2019-01-19 NOTE — Anesthesia Procedure Notes (Signed)
Procedure Name: LMA Insertion Date/Time: 01/19/2019 8:46 AM Performed by: Theodosia Quay, CRNA Pre-anesthesia Checklist: Patient identified, Emergency Drugs available, Suction available, Patient being monitored and Timeout performed Patient Re-evaluated:Patient Re-evaluated prior to induction Oxygen Delivery Method: Circle system utilized Preoxygenation: Pre-oxygenation with 100% oxygen Induction Type: IV induction Ventilation: Mask ventilation without difficulty LMA: LMA with gastric port inserted LMA Size: 4.0 Number of attempts: 1 Placement Confirmation: positive ETCO2 and breath sounds checked- equal and bilateral Tube secured with: Tape Dental Injury: Teeth and Oropharynx as per pre-operative assessment  Comments: Easy pass.  Atraumatic.

## 2019-01-20 ENCOUNTER — Encounter (HOSPITAL_COMMUNITY): Payer: Self-pay | Admitting: Urology

## 2019-06-17 ENCOUNTER — Other Ambulatory Visit: Payer: Self-pay | Admitting: Urology

## 2019-06-17 MED ORDER — TAMSULOSIN HCL 0.4 MG PO CAPS: 0.4000 mg | ORAL_CAPSULE | Freq: Every morning | ORAL | 5 refills | Status: AC

## 2019-06-17 NOTE — Progress Notes (Signed)
Flomax prescribed

## 2019-08-25 ENCOUNTER — Other Ambulatory Visit: Payer: Self-pay

## 2019-08-25 DIAGNOSIS — Z20822 Contact with and (suspected) exposure to covid-19: Secondary | ICD-10-CM

## 2019-08-26 LAB — NOVEL CORONAVIRUS, NAA: SARS-CoV-2, NAA: NOT DETECTED
